# Patient Record
Sex: Female | Born: 1985 | Race: Black or African American | Hispanic: No | Marital: Married | Smoking: Never smoker
Health system: Southern US, Community
[De-identification: ages and names within clinical notes are randomized; demographics above are authoritative.]

## PROBLEM LIST (undated history)

## (undated) DIAGNOSIS — N809 Endometriosis, unspecified: Secondary | ICD-10-CM

## (undated) HISTORY — DX: Endometriosis, unspecified: N80.9

## (undated) HISTORY — PX: MYOMECTOMY: SHX85

---
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
## 2008-10-13 ENCOUNTER — Emergency Department (HOSPITAL_COMMUNITY): Admission: EM | Admit: 2008-10-13 | Discharge: 2008-10-13 | Payer: Self-pay | Admitting: Emergency Medicine

## 2008-11-26 ENCOUNTER — Emergency Department: Payer: Self-pay | Admitting: Emergency Medicine

## 2009-08-19 ENCOUNTER — Emergency Department: Payer: Self-pay | Admitting: Emergency Medicine

## 2010-04-09 ENCOUNTER — Ambulatory Visit: Payer: Self-pay | Admitting: Internal Medicine

## 2010-04-09 IMAGING — US TRANSABDOMINAL ULTRASOUND OF PELVIS
1 series · 17 of 25 positions shown · non-contrast
Comparison: none

REASON FOR EXAM: Pelvic Pain
COMMENTS:

[Series 1: transabdominal ultrasound of pelvis · 17 of 78 slices shown]
[im 1/78]
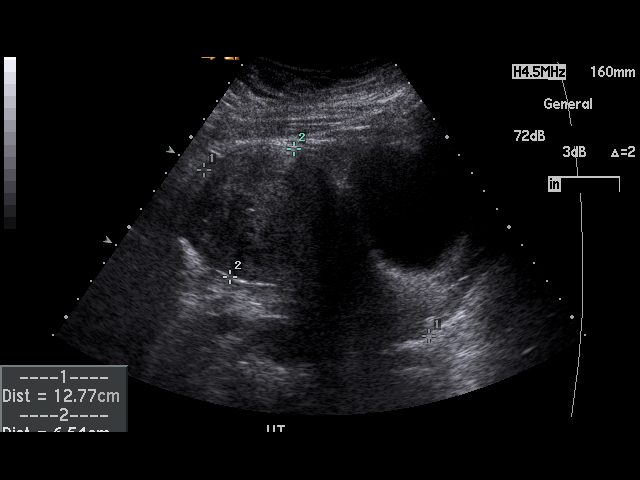
[im 7/78]
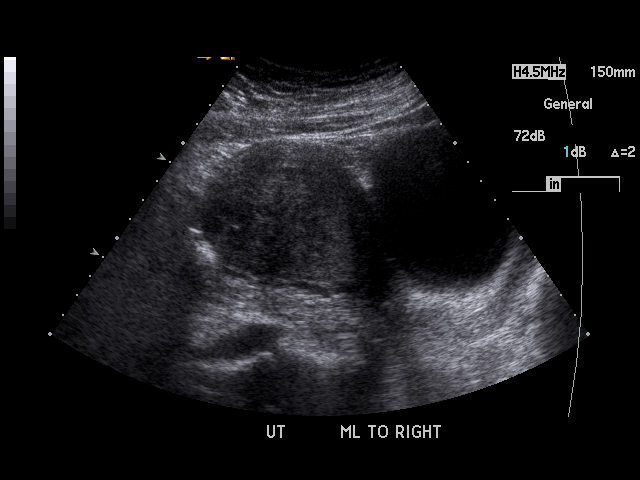
[im 10/78]
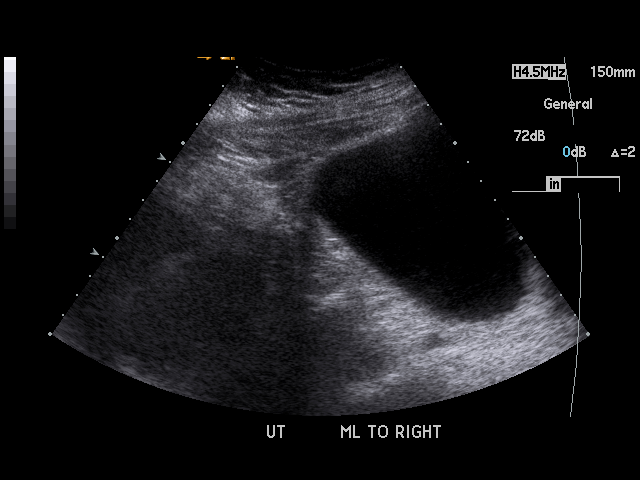
[im 17/78]
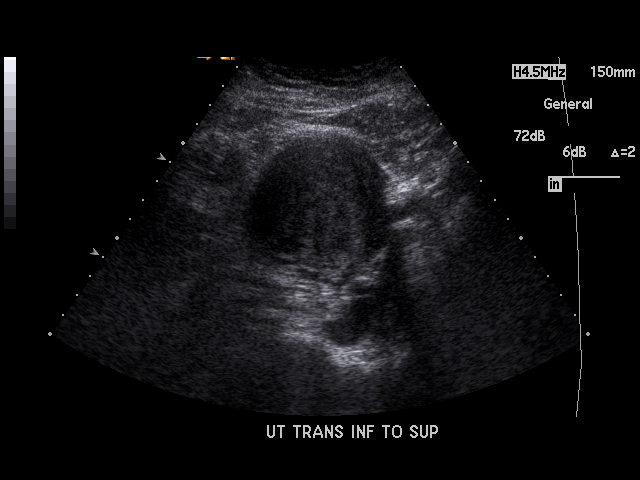
[im 20/78]
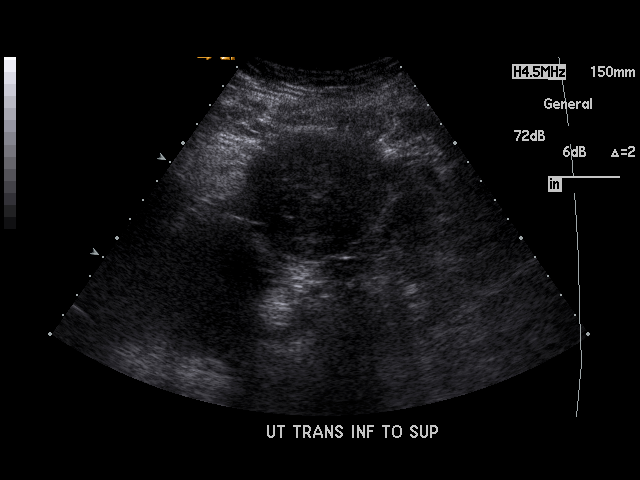
[im 26/78]
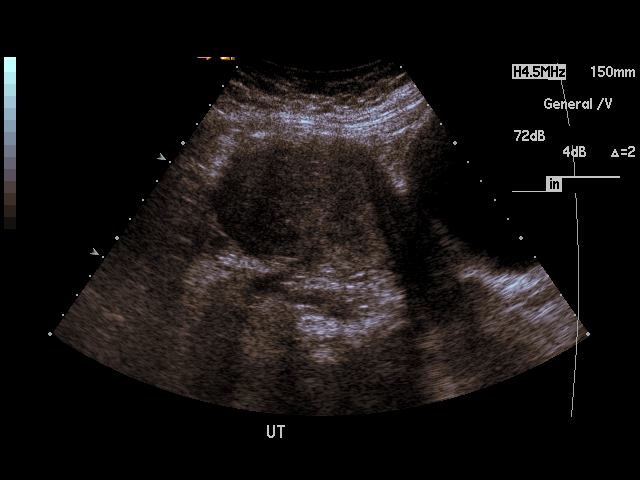
[im 29/78]
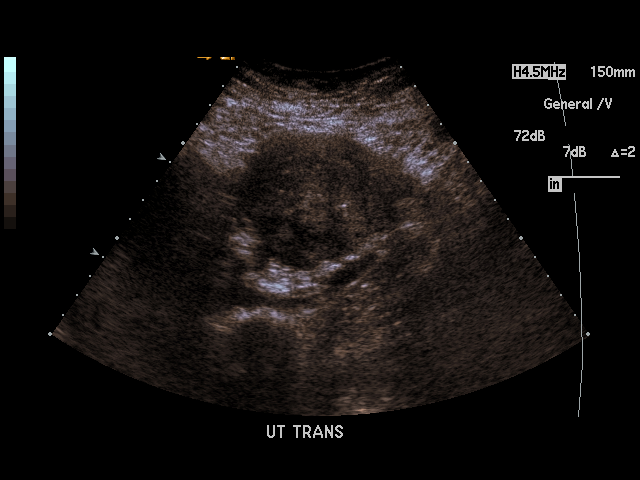
[im 36/78]
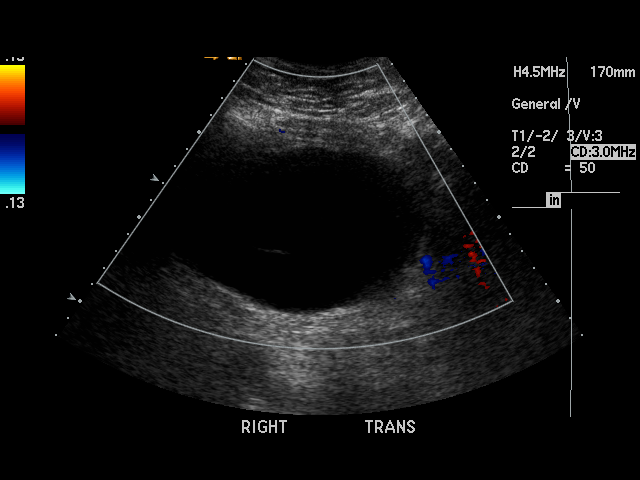
[im 39/78]
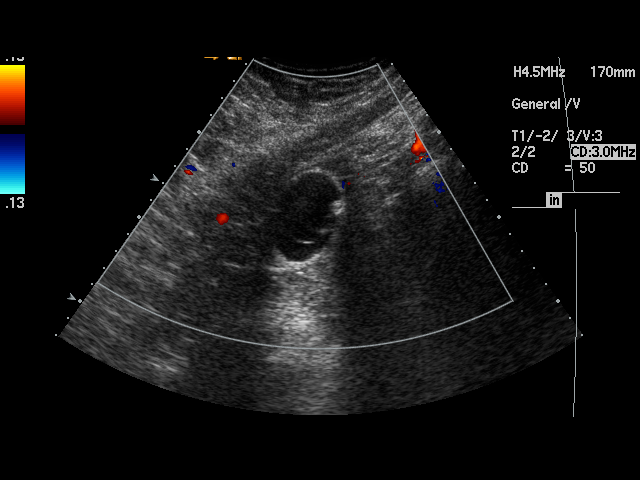
[im 42/78]
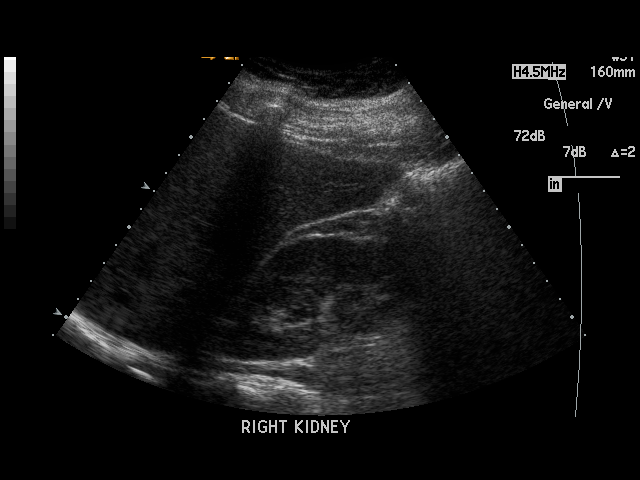
[im 49/78]
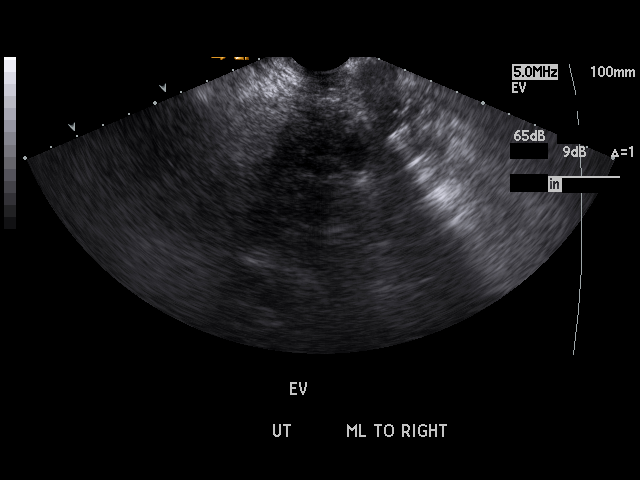
[im 52/78]
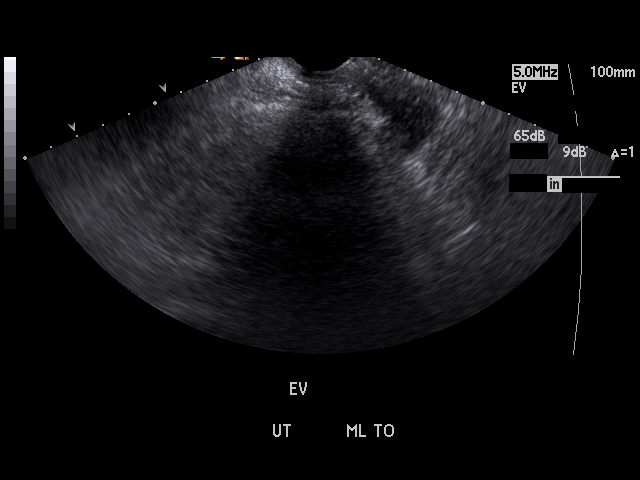
[im 58/78]
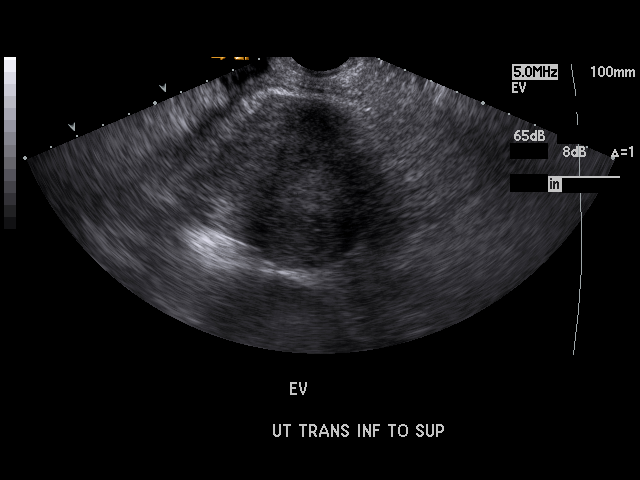
[im 61/78]
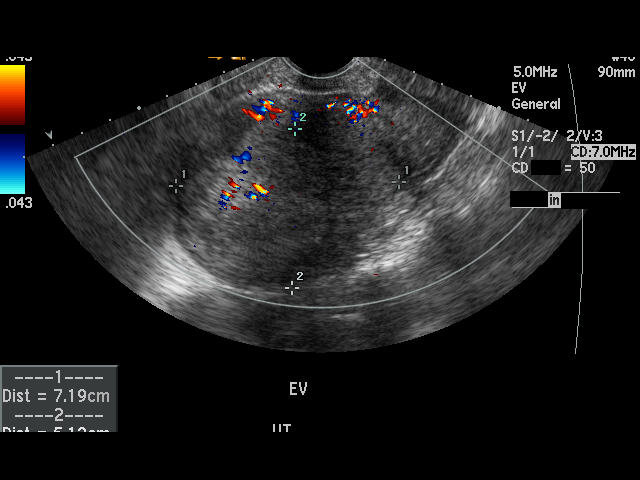
[im 68/78]
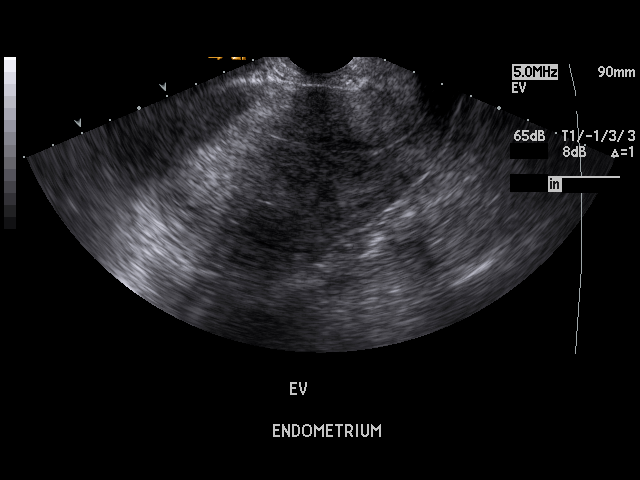
[im 71/78]
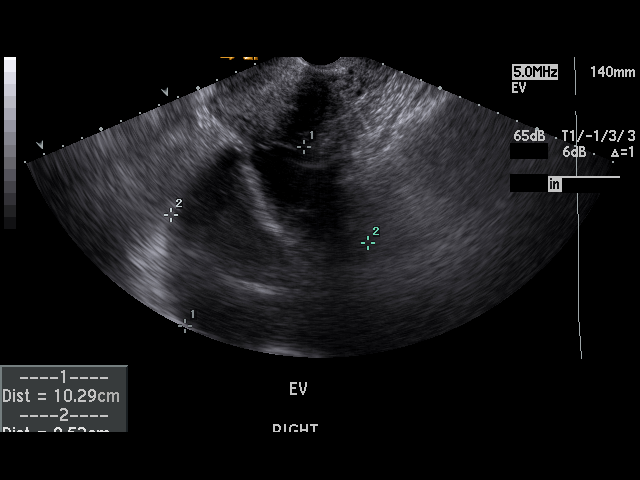
[im 78/78]
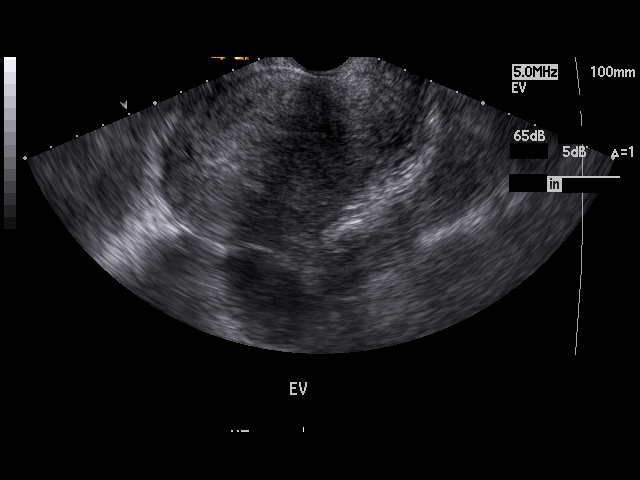

[17 of 25 positions shown; findings below may reference images not displayed]

PROCEDURE:     EDUM - EDUM PELVIS NON-OB W/TRANSVAGINAL  - [DATE]  [DATE]

RESULT:     Pelvic sonogram is performed with transabdominal and endovaginal
imaging. The uterus measures 12.8 x 6.5 x 7.4 cm. There is a large
solid-appearing mass along the posterior aspect of the uterus toward the
fundus. This displaces the endometrial stripe anteriorly. The endometrium
measures approximately 3.0 mm. There is blood flow in the uterine mass.
There is a large complex area of decreased echogenicity predominantly with
some irregular margins which, on some images, has a curvilinear or tubular
structure extending toward it. This area measures 12.9 x 12.7 x 8.0 cm.
Whether this represents a large cystic mass in the adnexa, pyosalpinx or
pelvic inflammatory disease with abscess and pyosalpinx is uncertain.
Correlate with clinical and laboratory data and gynecologic followup. Within
the left adnexa there is a cystic area measuring 2.71 x 4.91 x 3.62 cm.
There is through transmission. There appears to be internal debris and
possibly a septation associated with this cystic structure. The kidneys
appear to be grossly normal. No identified normal ovarian tissue is seen.
IMPRESSION: 1. Uterine mass posteriorly.
2. Predominately cystic abnormality in the right adnexa which certainly
could represent a cystic mass or infection and abscess.
3. Septated cyst with debris in the left adnexa.

## 2010-06-08 HISTORY — PX: OOPHORECTOMY: SHX86

## 2010-06-17 ENCOUNTER — Ambulatory Visit: Payer: Self-pay | Admitting: Obstetrics and Gynecology

## 2010-06-24 ENCOUNTER — Inpatient Hospital Stay: Payer: Self-pay | Admitting: Obstetrics and Gynecology

## 2010-06-27 LAB — PATHOLOGY REPORT

## 2012-07-08 ENCOUNTER — Emergency Department: Payer: Self-pay | Admitting: Emergency Medicine

## 2012-07-08 LAB — COMPREHENSIVE METABOLIC PANEL
Albumin: 3.6 g/dL (ref 3.4–5.0)
Alkaline Phosphatase: 58 U/L (ref 50–136)
Anion Gap: 5 — ABNORMAL LOW (ref 7–16)
BUN: 9 mg/dL (ref 7–18)
Bilirubin,Total: 0.2 mg/dL (ref 0.2–1.0)
Calcium, Total: 9 mg/dL (ref 8.5–10.1)
Chloride: 107 mmol/L (ref 98–107)
Co2: 27 mmol/L (ref 21–32)
Creatinine: 0.67 mg/dL (ref 0.60–1.30)
EGFR (African American): >60
EGFR (Non-African Amer.): >60
Glucose: 85 mg/dL (ref 65–99)
Osmolality: 275 (ref 275–301)
Potassium: 3.8 mmol/L (ref 3.5–5.1)
SGOT(AST): 13 U/L — ABNORMAL LOW (ref 15–37)
SGPT (ALT): 16 U/L (ref 12–78)
Sodium: 139 mmol/L (ref 136–145)
Total Protein: 8 g/dL (ref 6.4–8.2)

## 2012-07-08 LAB — URINALYSIS, COMPLETE
Bacteria: NONE SEEN
Bilirubin,UR: NEGATIVE
Glucose,UR: NEGATIVE mg/dL (ref 0–75)
Ketone: NEGATIVE
Nitrite: NEGATIVE
Ph: 6 (ref 4.5–8.0)
Protein: 30
RBC,UR: 5548 /HPF (ref 0–5)
Specific Gravity: 1.012 (ref 1.003–1.030)
Squamous Epithelial: NONE SEEN
WBC UR: 125 /HPF (ref 0–5)

## 2012-07-08 LAB — CBC
HCT: 32.2 % — ABNORMAL LOW (ref 35.0–47.0)
HGB: 9.8 g/dL — ABNORMAL LOW (ref 12.0–16.0)
MCH: 20.5 pg — ABNORMAL LOW (ref 26.0–34.0)
MCHC: 30.5 g/dL — ABNORMAL LOW (ref 32.0–36.0)
MCV: 67 fL — ABNORMAL LOW (ref 80–100)
Platelet: 288 10*3/uL (ref 150–440)
RBC: 4.78 10*6/uL (ref 3.80–5.20)
RDW: 19.1 % — ABNORMAL HIGH (ref 11.5–14.5)
WBC: 8.5 10*3/uL (ref 3.6–11.0)

## 2012-07-08 LAB — LIPASE, BLOOD: Lipase: 113 U/L (ref 73–393)

## 2012-07-08 LAB — WET PREP, GENITAL

## 2013-06-03 ENCOUNTER — Emergency Department: Payer: Self-pay | Admitting: Emergency Medicine

## 2013-06-03 LAB — COMPREHENSIVE METABOLIC PANEL
Albumin: 3.3 g/dL — ABNORMAL LOW (ref 3.4–5.0)
Alkaline Phosphatase: 53 U/L
Anion Gap: 5 — ABNORMAL LOW (ref 7–16)
BUN: 8 mg/dL (ref 7–18)
Bilirubin,Total: 0.3 mg/dL (ref 0.2–1.0)
Calcium, Total: 8.6 mg/dL (ref 8.5–10.1)
Chloride: 107 mmol/L (ref 98–107)
Co2: 26 mmol/L (ref 21–32)
Creatinine: 0.7 mg/dL (ref 0.60–1.30)
EGFR (African American): >60
EGFR (Non-African Amer.): >60
Glucose: 106 mg/dL — ABNORMAL HIGH (ref 65–99)
Osmolality: 274 (ref 275–301)
Potassium: 3.9 mmol/L (ref 3.5–5.1)
SGOT(AST): 9 U/L — ABNORMAL LOW (ref 15–37)
SGPT (ALT): 15 U/L (ref 12–78)
Sodium: 138 mmol/L (ref 136–145)
Total Protein: 7.6 g/dL (ref 6.4–8.2)

## 2013-06-03 LAB — CBC WITH DIFFERENTIAL/PLATELET
Basophil #: 0.2 10*3/uL — ABNORMAL HIGH (ref 0.0–0.1)
Basophil %: 1.9 %
Eosinophil #: 0.6 10*3/uL (ref 0.0–0.7)
Eosinophil %: 8.1 %
HCT: 30.1 % — ABNORMAL LOW (ref 35.0–47.0)
HGB: 8.9 g/dL — ABNORMAL LOW (ref 12.0–16.0)
Lymphocyte #: 2.2 10*3/uL (ref 1.0–3.6)
Lymphocyte %: 27.9 %
MCH: 18.9 pg — ABNORMAL LOW (ref 26.0–34.0)
MCHC: 29.6 g/dL — ABNORMAL LOW (ref 32.0–36.0)
MCV: 64 fL — ABNORMAL LOW (ref 80–100)
Monocyte #: 0.8 x10 3/mm (ref 0.2–0.9)
Monocyte %: 10.5 %
Neutrophil #: 4.1 10*3/uL (ref 1.4–6.5)
Neutrophil %: 51.6 %
Platelet: 323 10*3/uL (ref 150–440)
RBC: 4.72 10*6/uL (ref 3.80–5.20)
RDW: 19.6 % — ABNORMAL HIGH (ref 11.5–14.5)
WBC: 7.9 10*3/uL (ref 3.6–11.0)

## 2013-06-03 LAB — URINALYSIS, COMPLETE
Bacteria: NONE SEEN
Bilirubin,UR: NEGATIVE
Glucose,UR: NEGATIVE mg/dL (ref 0–75)
Ketone: NEGATIVE
Nitrite: NEGATIVE
Ph: 6 (ref 4.5–8.0)
Protein: 100
RBC,UR: 5016 /HPF (ref 0–5)
Specific Gravity: 1.015 (ref 1.003–1.030)
Squamous Epithelial: 4
WBC UR: 10 /HPF (ref 0–5)

## 2013-07-18 ENCOUNTER — Emergency Department: Payer: Self-pay | Admitting: Emergency Medicine

## 2013-07-18 LAB — CBC
HCT: 29.1 % — ABNORMAL LOW (ref 35.0–47.0)
HGB: 8.6 g/dL — ABNORMAL LOW (ref 12.0–16.0)
MCH: 19 pg — ABNORMAL LOW (ref 26.0–34.0)
MCHC: 29.4 g/dL — ABNORMAL LOW (ref 32.0–36.0)
MCV: 65 fL — ABNORMAL LOW (ref 80–100)
Platelet: 300 10*3/uL (ref 150–440)
RBC: 4.5 10*6/uL (ref 3.80–5.20)
RDW: 20.1 % — ABNORMAL HIGH (ref 11.5–14.5)
WBC: 11.7 10*3/uL — ABNORMAL HIGH (ref 3.6–11.0)

## 2013-07-18 LAB — URINALYSIS, COMPLETE
Bacteria: NONE SEEN
Bilirubin,UR: NEGATIVE
Blood: NEGATIVE
Glucose,UR: NEGATIVE mg/dL (ref 0–75)
Ketone: NEGATIVE
Nitrite: NEGATIVE
Ph: 7 (ref 4.5–8.0)
Protein: NEGATIVE
RBC,UR: <1 /HPF (ref 0–5)
Specific Gravity: 1.017 (ref 1.003–1.030)
Squamous Epithelial: 9
WBC UR: 4 /HPF (ref 0–5)

## 2013-07-18 LAB — COMPREHENSIVE METABOLIC PANEL
Albumin: 3.3 g/dL — ABNORMAL LOW (ref 3.4–5.0)
Alkaline Phosphatase: 51 U/L
Anion Gap: 3 — ABNORMAL LOW (ref 7–16)
BUN: 14 mg/dL (ref 7–18)
Bilirubin,Total: 0.2 mg/dL (ref 0.2–1.0)
Calcium, Total: 9.8 mg/dL (ref 8.5–10.1)
Chloride: 103 mmol/L (ref 98–107)
Co2: 29 mmol/L (ref 21–32)
Creatinine: 0.77 mg/dL (ref 0.60–1.30)
EGFR (African American): >60
EGFR (Non-African Amer.): >60
Glucose: 96 mg/dL (ref 65–99)
Osmolality: 270 (ref 275–301)
Potassium: 4.1 mmol/L (ref 3.5–5.1)
SGOT(AST): 16 U/L (ref 15–37)
SGPT (ALT): 14 U/L (ref 12–78)
Sodium: 135 mmol/L — ABNORMAL LOW (ref 136–145)
Total Protein: 7.8 g/dL (ref 6.4–8.2)

## 2013-07-18 LAB — PREGNANCY, URINE: Pregnancy Test, Urine: NEGATIVE m[IU]/mL

## 2013-07-18 LAB — GC/CHLAMYDIA PROBE AMP

## 2013-07-18 LAB — HCG, QUANTITATIVE, PREGNANCY: Beta Hcg, Quant.: <1 m[IU]/mL — ABNORMAL LOW

## 2013-07-18 LAB — WET PREP, GENITAL

## 2013-11-22 DIAGNOSIS — Z9889 Other specified postprocedural states: Secondary | ICD-10-CM | POA: Insufficient documentation

## 2013-11-22 DIAGNOSIS — O99019 Anemia complicating pregnancy, unspecified trimester: Secondary | ICD-10-CM | POA: Insufficient documentation

## 2013-11-22 DIAGNOSIS — O3429 Maternal care due to uterine scar from other previous surgery: Secondary | ICD-10-CM | POA: Insufficient documentation

## 2014-06-05 DIAGNOSIS — Z975 Presence of (intrauterine) contraceptive device: Secondary | ICD-10-CM | POA: Insufficient documentation

## 2014-10-02 ENCOUNTER — Emergency Department
Admit: 2014-10-02 | Disposition: A | Discharge status: Home / Self Care | Payer: Self-pay | Admitting: Emergency Medicine

## 2014-10-02 LAB — CBC WITH DIFFERENTIAL/PLATELET
Basophil #: 0.1 10*3/uL (ref 0.0–0.1)
Basophil %: 1.3 %
Eosinophil #: 0.2 10*3/uL (ref 0.0–0.7)
Eosinophil %: 3.8 %
HCT: 36.2 % (ref 35.0–47.0)
HGB: 11.2 g/dL — ABNORMAL LOW (ref 12.0–16.0)
Lymphocyte #: 1.9 10*3/uL (ref 1.0–3.6)
Lymphocyte %: 33.5 %
MCH: 22.5 pg — ABNORMAL LOW (ref 26.0–34.0)
MCHC: 30.9 g/dL — ABNORMAL LOW (ref 32.0–36.0)
MCV: 73 fL — ABNORMAL LOW (ref 80–100)
Monocyte #: 0.4 x10 3/mm (ref 0.2–0.9)
Monocyte %: 6.4 %
Neutrophil #: 3.1 10*3/uL (ref 1.4–6.5)
Neutrophil %: 55 %
Platelet: 286 10*3/uL (ref 150–440)
RBC: 4.98 10*6/uL (ref 3.80–5.20)
RDW: 18.8 % — ABNORMAL HIGH (ref 11.5–14.5)
WBC: 5.7 10*3/uL (ref 3.6–11.0)

## 2014-10-02 LAB — BASIC METABOLIC PANEL
Anion Gap: 5 — ABNORMAL LOW (ref 7–16)
BUN: 11 mg/dL
Calcium, Total: 8.9 mg/dL
Chloride: 106 mmol/L
Co2: 27 mmol/L
Creatinine: 0.72 mg/dL
EGFR (African American): >60
EGFR (Non-African Amer.): >60
Glucose: 99 mg/dL
Potassium: 4.3 mmol/L
Sodium: 138 mmol/L

## 2014-10-02 LAB — URINALYSIS, COMPLETE
Specific Gravity: 1.02 (ref 1.003–1.030)
Squamous Epithelial: NONE SEEN

## 2014-10-02 LAB — HCG, QUANTITATIVE, PREGNANCY: Beta Hcg, Quant.: <1 m[IU]/mL

## 2015-06-21 ENCOUNTER — Ambulatory Visit: Payer: BLUE CROSS/BLUE SHIELD | Attending: Obstetrics and Gynecology | Admitting: Physical Therapy

## 2015-06-21 ENCOUNTER — Encounter: Payer: Self-pay | Admitting: Physical Therapy

## 2015-06-21 DIAGNOSIS — R279 Unspecified lack of coordination: Secondary | ICD-10-CM

## 2015-06-21 DIAGNOSIS — M629 Disorder of muscle, unspecified: Secondary | ICD-10-CM | POA: Insufficient documentation

## 2015-06-21 DIAGNOSIS — Z7409 Other reduced mobility: Secondary | ICD-10-CM | POA: Insufficient documentation

## 2015-06-21 NOTE — Therapy (Signed)
[place] [facility] MAIN [facility] [address] [place], [place], [address] Phone: [phone]   Fax:  [phone]  Physical Therapy Evaluation  Patient Details  Name: Chelsea Willis MRN: [id number] Date of Birth: 04/07/1986 Referring Provider: Carles ColletSchiff  Encounter Date: 06/21/2015      PT End of Session - 06/21/15 1909    Visit Number 1   Number of Visits 12   Date for PT Re-Evaluation 09/13/15   PT Start Time 0815   PT Stop Time 0910   PT Time Calculation (min) 55 min   Activity Tolerance Patient tolerated treatment well;No increased pain   Behavior During Therapy [facility] for tasks assessed/performed      Past Medical History  Diagnosis Date  . Endometriosis     Past Surgical History  Procedure Laterality Date  . Abdominal hysterectomy    . Myomectomy Right     2012  . Oophorectomy Right 2012  . Cesarean section  2015    There were no vitals filed for this visit.  Visit Diagnosis:  Fascial defect - Plan: PT plan of care cert/re-cert  Lack of coordination - Plan: PT plan of care cert/re-cert  Mobility impaired - Plan: PT plan of care cert/re-cert      Subjective Assessment - 06/21/15 0823    Subjective Pt experiences a constant "throbbing, sharp, crampy pain " (7/10)   in her " lower stomach, sometimes upper stomach". Pt has been dealing with these Sx for the past 30 yo except when she was pregnant with 2nd child Feb 2015 to Nov 2016.  Pt reports this pain can hurt to the point she has to stay in bedd, take of work, and decreased household activities. Pt has had to miss work 3x across 2 months. Pt tries to take ibuprofen (3 tablets at 500 mg) when the pain is unbearable 10/10 whiich has occurred  4x/ month. Typically if pain is at 7/10, she takes one tablet at 500 mg. Pt has found opiates and heat to be ineeffective to relieve pain.  Currently, pain is at a 8.5-9/10.  Denied constipation, urinary incontinence, dyspareunia. Pt has  changed her diet, eliminating sodas, fries. Pt will be going to the gym tomorrow. Pt plans to do squats,      Pertinent History multiple abadominal surgeries, one vaginal delivery and one c-section, Hx of endometriosis   Patient Stated Goals lose weight, figure out how to get out of pain to play with her kids             [facility] PT Assessment - 06/21/15 0853    Assessment   Medical Diagnosis pelvic pain    Referring Provider Schiff   Precautions   Precautions None   Restrictions   Weight Bearing Restrictions No   Balance Screen   Has the patient fallen in the past 6 months No   Prior Function   Level of Independence Independent   Observation/Other Assessments   Observations 350 lbs  ankles crossed under chair   Other Surveys  --  PDI 73%   Coordination   Gross Motor Movements are Fluid and Coordinated --  limited diaphragmatic excursion, elevated ribcage    Fine Motor Movements are Fluid and Coordinated --  abdominal straining w/ cue for bowel movement   Posture/Postural Control   Posture Comments increased lumbar lordosis, anterior tilt of pelvis   ROM / Strength   AROM / PROM / Strength --  rotating L and ext w/ p! in  standing    Palpation   Palpation comment scar restrictions along B LQ of abdomen, tenderness and increased tensions along bilateral and medial sections of entire abdomen to sternum    Bed Mobility   Bed Mobility --  half crunch method, guided to log roll                            PT Education - 06/21/15 0846    Education provided No             PT Long Term Goals - 06/21/15 0843    PT LONG TERM GOAL #1   Title Pt will decrease her PDI score from 71% to < 50% in order to increase QOL.    Time 12   Period Weeks   Status New   PT LONG TERM GOAL #2   Title Pt will reprot no abdominal pain when carrying son across half the length of the mall in order to participate with her family.    Time 12   Period Weeks   Status New    PT LONG TERM GOAL #3   Title Pt will be able to stand when singing at church with less sitting breaks from 4-5x to 2x during thr 1.5 hr practice in order to particiapte in community events.    Time 12   Period Weeks   Status New   PT LONG TERM GOAL #4   Title Pt will able to perform full spinal L rotation and extension in standing without report of pain in order to perform household activities.    Time 12   Period Weeks   Status New   PT LONG TERM GOAL #5   Title Pt's weight will decrease from 350 lbs to 340lbs in order to promote health and being with decreased load on deep core system.    Time 12   Period Weeks   Status New               Plan - 06/21/15 1911    Clinical Impression Statement Pt is a 30 yo who complains of chronic abdominal pain that have impacted her ability to sit to stand,  work, play with her children, and perform household work. Pt's personal factors that contribute to her pain include Hx of C-section, vaginal delivery, hysterectomy, and endometriosis. Pt's clinical presentations include tenderness w/ palpation along upper /lower quadrants of her abdomen, abdominal scar restrictions, postural deficits, abdominal pain with spinal rotation L and extension,  poor deep core coordination/strength,  faulty patterns of movement that placed strain on her abdomen and pelvic floor, and poor understanding on how to exercise at the gym safely.  Given these factors, pt's condition is  evolving and moderate in complexity.     Pt will benefit from skilled therapeutic intervention in order to improve on the following deficits Obesity;Pain;Increased fascial restricitons;Decreased scar mobility;Decreased endurance;Decreased coordination;Decreased safety awareness;Decreased mobility;Impaired flexibility;Decreased activity tolerance;Difficulty walking;Postural dysfunction;Abnormal gait;Decreased strength;Increased muscle spasms;Improper body mechanics;Decreased range of motion   Rehab  Potential Good   Clinical Impairments Affecting Rehab Potential obesity, sedentary job,    PT Frequency 1x / week   PT Duration 12 weeks   PT Treatment/Interventions ADLs/Self Care Home Management;Aquatic Therapy;Cryotherapy;Electrical Stimulation;Moist Heat;Traction;Patient/family education;Neuromuscular re-education;Balance training;Therapeutic exercise;Therapeutic activities;Functional mobility training;Stair training;Gait training;Manual techniques;Scar mobilization;Dry needling;Energy conservation;Passive range of motion  education on principle of exercise to create a routine to help address her weight loss goal with exercise   Consulted  and Agree with Plan of Care Patient         Problem List There are no active problems to display for this patient.   Mariane Masters  ,PT, DPT, E-RYT  06/21/2015, 7:34 PM   [facility] MAIN [facility] SERVICES [address] [place], [place], [address] Phone: [phone]   Fax:  [phone]  Name: Chelsea Willis MRN: [id number] Date of Birth: 04/07/1986

## 2015-06-21 NOTE — Patient Instructions (Addendum)
Sit to stand with exhale    Exhale w. Stand     Log rolling (handout)   Scar massage. (handout)    Circular belly massage

## 2015-06-24 ENCOUNTER — Ambulatory Visit: Payer: BLUE CROSS/BLUE SHIELD | Admitting: Physical Therapy

## 2015-06-24 DIAGNOSIS — R279 Unspecified lack of coordination: Secondary | ICD-10-CM

## 2015-06-24 DIAGNOSIS — M629 Disorder of muscle, unspecified: Secondary | ICD-10-CM | POA: Diagnosis not present

## 2015-06-24 DIAGNOSIS — Z7409 Other reduced mobility: Secondary | ICD-10-CM

## 2015-06-24 NOTE — Patient Instructions (Addendum)
You are now ready to begin training the deep core muscles system: diaphragm, transverse abdominis, pelvic floor . These muscles must work together as a team.           The key to these exercises to train the brain to coordinate the timing of these muscles and to have them turn on for long periods of time to hold you upright against gravity (especially important if you are on your feet all day).These muscles are postural muscles and play a role stabilizing your spine and bodyweight. By doing these repetitions slowly and correctly instead of doing crunches, you will achieve a flatter belly without a lower pooch. You are also placing your spine in a more neutral position and breathing properly which in turn, decreases your risk for problems related to your pelvic floor, abdominal, and low back such as pelvic organ prolapse, hernias, diastasis recti (separation of superficial muscles), disk herniations, spinal fractures. These exercises set a solid foundation for you to later progress to resistance/ strength training with therabands and weights and return to other typical fitness exercises with a stronger deeper core.   This week: Practice only Level 1-2. Hold off on 3-4 until our next visit.      Standing and sitting posture  -find pelvic neutral   Standing with one foot forward on ski tracks (wide as your hips)  Standing with feet hip width apart with KNEES UNLOCKED  Pelvic neutral

## 2015-06-26 NOTE — Therapy (Addendum)
[place] [facility] MAIN [facility] SERVICES [address] [place], [place], [address] Phone: [phone]   Fax:  [phone]  Physical Therapy Treatment  Patient Details  Name: Chelsea Willis MRN: [id number] Date of Birth: Nov 11, 1985 Referring Provider: Carles Collet  Encounter Date: 06/24/2015      PT End of Session - 06/30/15 2251    Visit Number 2   Number of Visits 12   Date for PT Re-Evaluation 09/13/15   PT Start Time 1620   PT Stop Time 1715   PT Time Calculation (min) 55 min   Activity Tolerance Patient tolerated treatment well;No increased pain   Behavior During Therapy [facility] for tasks assessed/performed      Past Medical History  Diagnosis Date  . Endometriosis     Past Surgical History  Procedure Laterality Date  . Abdominal hysterectomy    . Myomectomy Right     2012  . Oophorectomy Right 2012  . Cesarean section  2015    There were no vitals filed for this visit.  Visit Diagnosis:  Fascial defect  Lack of coordination  Mobility impaired      Subjective Assessment - 06/30/15 2241    Subjective Pt reported her abdominal pain was decreased to a barable level 5/10 from 8/10 after last session which lasted for the past 3 days. Pt's husband was able to massage her scar. Pt's pain recoccurred at 8/10 today but it decrease to 5/10 after she performed self-abdominal massage.   Pertinent History multiple abadominal surgeries, one vaginal delivery and one c-section, Hx of endometriosis   Patient Stated Goals lose weight, figure out how to get out of pain to play with her kids             [facility] PT Assessment - 06/30/15 2241    Observation/Other Assessments   Observations hyperextended knees in stance    Palpation   Palpation comment decreased scar restriction greater on R end  > L    Bed Mobility   Bed Mobility --  mild cuing for log rolling                  Pelvic Floor Special Questions - 06/30/15 2247     Pelvic Floor Internal Exam pt consented verbally without contraindications noted   Exam Type Vaginal   Palpation mm tensions and tenderness on R bulbospongiosis, L ischiocav           [facility] Adult PT Treatment/Exercise - 06/30/15 2241    Neuro Re-ed    Neuro Re-ed Details  standing posture, feet placement to decrease load on back, deep core 1-2    Manual Therapy   Manual therapy comments scar massage and myofascial release over suprapubic area   Internal Pelvic Floor thiele massage, sustained light pressure over R bulbospongiosus, R ischiocavernosus                      PT Long Term Goals - 06/21/15 0843    PT LONG TERM GOAL #1   Title Pt will decrease her PDI score from 71% to < 50% in order to increase QOL.    Time 12   Period Weeks   Status New   PT LONG TERM GOAL #2   Title Pt will reprot no abdominal pain when carrying son across half the length of the mall in order to participate with her family.    Time 12   Period Weeks   Status New  PT LONG TERM GOAL #3   Title Pt will be able to stand when singing at church with less sitting breaks from 4-5x to 2x during thr 1.5 hr practice in order to particiapte in community events.    Time 12   Period Weeks   Status New   PT LONG TERM GOAL #4   Title Pt will able to perform full spinal L rotation and extension in standing without report of pain in order to perform household activities.    Time 12   Period Weeks   Status New   PT LONG TERM GOAL #5   Title Pt's weight will decrease from 350 lbs to 340lbs in order to promote health and being with decreased load on deep core system.    Time 12   Period Weeks   Status New               Plan - 06/30/15 2251    Clinical Impression Statement Pt reported she has felt decreased pain since last session with application of belly massage. After today's Tx, her abdominal pain decreased from 5/10 to 3/10. Pt showed increased mm tensions/ tenderness in pelvic floor mm  which decreased after post Tx. Pt also tolerated abdominal scar massage on R side of the scar which showed improved pliability after Tx. Progressed pt to deep core exercises 1-2 and pt demo'd standing mechanics to apply at work to redistribute loading forces through lumbopelvic area. Anticipate pt will continue to progress well towards her goals.    Pt will benefit from skilled therapeutic intervention in order to improve on the following deficits Obesity;Pain;Increased fascial restricitons;Decreased scar mobility;Decreased endurance;Decreased coordination;Decreased safety awareness;Decreased mobility;Impaired flexibility;Decreased activity tolerance;Difficulty walking;Postural dysfunction;Abnormal gait;Decreased strength;Increased muscle spasms;Improper body mechanics;Decreased range of motion   Rehab Potential Good   Clinical Impairments Affecting Rehab Potential obesity, sedentary job,    PT Frequency 1x / week   PT Duration 12 weeks   PT Treatment/Interventions ADLs/Self Care Home Management;Aquatic Therapy;Cryotherapy;Electrical Stimulation;Moist Heat;Traction;Patient/family education;Neuromuscular re-education;Balance training;Therapeutic exercise;Therapeutic activities;Functional mobility training;Stair training;Gait training;Manual techniques;Scar mobilization;Dry needling;Energy conservation;Passive range of motion  education on principle of exercise to create a routine to help address her weight loss goal with exercise   Consulted and Agree with Plan of Care Patient        Problem List There are no active problems to display for this patient.   Mariane Masters ,PT, DPT, E-RYT  06/30/2015, 10:56 PM  [place] [facility] MAIN [facility] SERVICES [address] [place], [place], [address] Phone: [phone]   Fax:  [phone]  Name: Lanell Jordan Caraveo MRN: [id number] Date of Birth: 11/06/1985

## 2015-07-02 ENCOUNTER — Ambulatory Visit: Payer: BLUE CROSS/BLUE SHIELD | Admitting: Physical Therapy

## 2015-07-03 ENCOUNTER — Ambulatory Visit: Payer: BLUE CROSS/BLUE SHIELD | Admitting: Physical Therapy

## 2015-07-03 DIAGNOSIS — M629 Disorder of muscle, unspecified: Secondary | ICD-10-CM

## 2015-07-03 DIAGNOSIS — R279 Unspecified lack of coordination: Secondary | ICD-10-CM

## 2015-07-03 DIAGNOSIS — Z7409 Other reduced mobility: Secondary | ICD-10-CM

## 2015-07-03 NOTE — Therapy (Signed)
[facility] MAIN [facility] SERVICES [address] [place], [place], [address] Phone: [phone]   Fax:  [phone]  Physical Therapy Treatment  Patient Details  Name: Chelsea Willis MRN: [id number] Date of Birth: [birth year] Referring Provider: Carles Collet  Encounter Date: 07/03/2015      PT End of Session - 07/03/15 2220    Visit Number 3   Number of Visits 12   Date for PT Re-Evaluation 09/13/15   PT Start Time 1750   PT Stop Time 1825   PT Time Calculation (min) 35 min   Activity Tolerance Patient tolerated treatment well;No increased pain   Behavior During Therapy [facility] for tasks assessed/performed      Past Medical History  Diagnosis Date  . Endometriosis     Past Surgical History  Procedure Laterality Date  . Abdominal hysterectomy    . Myomectomy Right     2012  . Oophorectomy Right 2012  . Cesarean section  2015    There were no vitals filed for this visit.  Visit Diagnosis:  Fascial defect  Lack of coordination  Mobility impaired      Subjective Assessment - 07/03/15 1751    Subjective Back pain has improved by 45% . Pelvic pain came on two days later after the session when she had a biopsy for endometriosis status and has lasted for the past 3 days. Today, pelvic pain is at a 9/10.    Pertinent History multiple abadominal surgeries, one vaginal delivery and one c-section, Hx of endometriosis   Patient Stated Goals lose weight, figure out how to get out of pain to play with her kids             [facility] PT Assessment - 07/03/15 2210    Observation/Other Assessments   Observations upper trap tensions    Palpation   Palpation comment decreased scar restriction R, focused on distal/ medial side of scar today                      [facility] Adult PT Treatment/Exercise - 07/03/15 2217    Neuro Re-ed    Neuro Re-ed Details  guided body scan and explained benefits with relaxation to manage endometriosis     emailed pt audio file    Manual Therapy   Manual therapy comments scar massage, abdominal massage                PT Education - 07/03/15 2219    Education provided Yes   Education Details HEP   Person(s) Educated Patient   Methods Explanation;Demonstration;Tactile cues;Verbal cues   Comprehension Verbalized understanding;Returned demonstration             PT Long Term Goals - 06/21/15 0843    PT LONG TERM GOAL #1   Title Pt will decrease her PDI score from 71% to < 50% in order to increase QOL.    Time 12   Period Weeks   Status New   PT LONG TERM GOAL #2   Title Pt will reprot no abdominal pain when carrying son across half the length of the mall in order to participate with her family.    Time 12   Period Weeks   Status New   PT LONG TERM GOAL #3   Title Pt will be able to stand when singing at church with less sitting breaks from 4-5x to 2x during thr 1.5 hr practice in order to particiapte in community events.  Time 12   Period Weeks   Status New   PT LONG TERM GOAL #4   Title Pt will able to perform full spinal L rotation and extension in standing without report of pain in order to perform household activities.    Time 12   Period Weeks   Status New   PT LONG TERM GOAL #5   Title Pt's weight will decrease from 350 lbs to 340lbs in order to promote health and being with decreased load on deep core system.    Time 12   Period Weeks   Status New               Plan - 07/03/15 2220    Clinical Impression Statement Pt reported 9/10 pelvic pain decreased to 7/10 pain after abdominal/scar massage and guided body scan technique. Pt's appt was abbreviated due to late arrival. Pt continues to progress well with report of 45% improvement on her LBP.  Pt  continues to require skilled PT to achieve goals.    Pt will benefit from skilled therapeutic intervention in order to improve on the following deficits Obesity;Pain;Increased fascial  restricitons;Decreased scar mobility;Decreased endurance;Decreased coordination;Decreased safety awareness;Decreased mobility;Impaired flexibility;Decreased activity tolerance;Difficulty walking;Postural dysfunction;Abnormal gait;Decreased strength;Increased muscle spasms;Improper body mechanics;Decreased range of motion   Rehab Potential Good   Clinical Impairments Affecting Rehab Potential obesity, sedentary job,    PT Frequency 1x / week   PT Duration 12 weeks   PT Treatment/Interventions ADLs/Self Care Home Management;Aquatic Therapy;Cryotherapy;Electrical Stimulation;Moist Heat;Traction;Patient/family education;Neuromuscular re-education;Balance training;Therapeutic exercise;Therapeutic activities;Functional mobility training;Stair training;Gait training;Manual techniques;Scar mobilization;Dry needling;Energy conservation;Passive range of motion  education on principle of exercise to create a routine to help address her weight loss goal with exercise   Consulted and Agree with Plan of Care Patient        Problem List There are no active problems to display for this patient.   Mariane Masters ,PT, DPT, E-RYT  07/03/2015, 10:24 PM  [place] [facility] MAIN [facility] SERVICES [address] [place], [place], [address] Phone: [phone]   Fax:  [phone]  Name: Chelsea Willis MRN: [id number] Date of Birth: 31-Oct-1985

## 2015-07-03 NOTE — Patient Instructions (Signed)
Body scan technique (audio emailed to pt)

## 2015-07-08 ENCOUNTER — Ambulatory Visit: Payer: BLUE CROSS/BLUE SHIELD | Admitting: Physical Therapy

## 2015-07-15 ENCOUNTER — Ambulatory Visit: Payer: BLUE CROSS/BLUE SHIELD | Attending: Obstetrics and Gynecology | Admitting: Physical Therapy

## 2015-07-22 ENCOUNTER — Ambulatory Visit: Payer: BLUE CROSS/BLUE SHIELD | Admitting: Physical Therapy

## 2015-07-29 ENCOUNTER — Ambulatory Visit: Payer: BLUE CROSS/BLUE SHIELD | Admitting: Physical Therapy

## 2015-08-03 ENCOUNTER — Emergency Department
Admission: EM | Admit: 2015-08-03 | Discharge: 2015-08-03 | Disposition: A | Discharge status: Home / Self Care | Payer: BLUE CROSS/BLUE SHIELD | Attending: Student | Admitting: Student

## 2015-08-03 ENCOUNTER — Encounter: Payer: Self-pay | Admitting: Emergency Medicine

## 2015-08-03 DIAGNOSIS — N939 Abnormal uterine and vaginal bleeding, unspecified: Secondary | ICD-10-CM | POA: Diagnosis present

## 2015-08-03 DIAGNOSIS — Z3202 Encounter for pregnancy test, result negative: Secondary | ICD-10-CM | POA: Insufficient documentation

## 2015-08-03 DIAGNOSIS — R102 Pelvic and perineal pain: Secondary | ICD-10-CM

## 2015-08-03 DIAGNOSIS — N946 Dysmenorrhea, unspecified: Secondary | ICD-10-CM | POA: Insufficient documentation

## 2015-08-03 DIAGNOSIS — Z79899 Other long term (current) drug therapy: Secondary | ICD-10-CM | POA: Insufficient documentation

## 2015-08-03 LAB — COMPREHENSIVE METABOLIC PANEL
ALT: 14 U/L (ref 14–54)
AST: 14 U/L — ABNORMAL LOW (ref 15–41)
Albumin: 3.5 g/dL (ref 3.5–5.0)
Alkaline Phosphatase: 49 U/L (ref 38–126)
Anion gap: 7 (ref 5–15)
BUN: 9 mg/dL (ref 6–20)
CO2: 27 mmol/L (ref 22–32)
Calcium: 8.6 mg/dL — ABNORMAL LOW (ref 8.9–10.3)
Chloride: 104 mmol/L (ref 101–111)
Creatinine, Ser: 0.63 mg/dL (ref 0.44–1.00)
GFR calc Af Amer: >60 mL/min (ref >60)
GFR calc non Af Amer: >60 mL/min (ref >60)
Glucose, Bld: 105 mg/dL — ABNORMAL HIGH (ref 65–99)
Potassium: 3.9 mmol/L (ref 3.5–5.1)
Sodium: 138 mmol/L (ref 135–145)
Total Bilirubin: 0.3 mg/dL (ref 0.3–1.2)
Total Protein: 7.3 g/dL (ref 6.5–8.1)

## 2015-08-03 LAB — CBC WITH DIFFERENTIAL/PLATELET
Basophils Absolute: 0.1 10*3/uL (ref 0–0.1)
Basophils Relative: 1 %
Eosinophils Absolute: 0.1 10*3/uL (ref 0–0.7)
Eosinophils Relative: 1 %
HCT: 34 % — ABNORMAL LOW (ref 35.0–47.0)
Hemoglobin: 10.9 g/dL — ABNORMAL LOW (ref 12.0–16.0)
Lymphocytes Relative: 21 %
Lymphs Abs: 1.5 10*3/uL (ref 1.0–3.6)
MCH: 23.3 pg — ABNORMAL LOW (ref 26.0–34.0)
MCHC: 32.2 g/dL (ref 32.0–36.0)
MCV: 72.4 fL — ABNORMAL LOW (ref 80.0–100.0)
Monocytes Absolute: 0.5 10*3/uL (ref 0.2–0.9)
Monocytes Relative: 7 %
Neutro Abs: 5 10*3/uL (ref 1.4–6.5)
Neutrophils Relative %: 70 %
Platelets: 244 10*3/uL (ref 150–440)
RBC: 4.69 MIL/uL (ref 3.80–5.20)
RDW: 22.7 % — ABNORMAL HIGH (ref 11.5–14.5)
WBC: 7.1 10*3/uL (ref 3.6–11.0)

## 2015-08-03 LAB — POCT PREGNANCY, URINE: Preg Test, Ur: NEGATIVE

## 2015-08-03 MED ORDER — MORPHINE SULFATE (PF) 4 MG/ML IV SOLN
8.0000 mg | Freq: Once | INTRAVENOUS | Status: AC
  Administered 2015-08-03: 8 mg via INTRAVENOUS
  Filled 2015-08-03: qty 2

## 2015-08-03 MED ORDER — ONDANSETRON 4 MG PO TBDP
4.0000 mg | ORAL_TABLET | Freq: Once | ORAL | Status: AC
  Administered 2015-08-03: 4 mg via ORAL
  Filled 2015-08-03: qty 1

## 2015-08-03 MED ORDER — OXYCODONE HCL 5 MG PO TABS: 5.0000 mg | ORAL_TABLET | Freq: Four times a day (QID) | ORAL | Status: DC | PRN

## 2015-08-03 MED ORDER — ONDANSETRON 4 MG PO TBDP: 4.0000 mg | ORAL_TABLET | Freq: Three times a day (TID) | ORAL | Status: DC | PRN

## 2015-08-03 NOTE — ED Notes (Signed)
Patient states that she is saturating 4 10hr pads per hour with heavy vaginal blood. Patient is in room comfortable NAD noted

## 2015-08-03 NOTE — ED Notes (Signed)
Pt here with clotting and cramping for 3 days now, states she has endometriosis.

## 2015-08-03 NOTE — ED Notes (Signed)
Takes flexeril for pain, however states it is not helping. Here today for pain control.

## 2015-08-03 NOTE — ED Provider Notes (Addendum)
[facility] Emergency Department Provider Note  ____________________________________________  Time seen: Approximately 9:21 AM  I have reviewed the triage vital signs and the nursing notes.   HISTORY  Chief Complaint Abdominal Cramping and Vaginal Bleeding    HPI Chelsea Willis is a 30 y.o. female with history of endometriosis and left pectoralis pelvic pain related to the floor spasm, fibroids, adenomyosis as well as ultrasound appearance of complex isthmocele who presents for evaluation of 3 days of lower abdominal cramping as well as vaginal bleeding in the setting of her menstrual period, gradual onset, constant since onset, currently moderate to severe, no modifying factors. The patient reports that she missed her period last month. She started having her period 3 days ago however it has been very painful. She typically takes Flexeril for pain but reports it is no longer helping. She reports this is the same pain she typically experiences however today it feels more severe. Of note, she is scheduled to have a hysterectomy at the end of March at [facility] for definitive treatment of this ongoing pelvic pain. No nausea, vomiting, diarrhea, fevers or chills. No chest pain or difficulty breathing. She is status post right oopherectomy and previous myomectomy.   Past Medical History  Diagnosis Date  . Endometriosis     There are no active problems to display for this patient.   Past Surgical History  Procedure Laterality Date  . Abdominal hysterectomy    . Myomectomy Right     2012  . Oophorectomy Right 2012  . Cesarean section  2015    Current Outpatient Rx  Name  Route  Sig  Dispense  Refill  . cyclobenzaprine (FLEXERIL) 10 MG tablet   Oral   Take 10 mg by mouth 3 (three) times daily as needed. For muscle spasms.         . ferrous sulfate 325 (65 FE) MG EC tablet   Oral   Take 325 mg by mouth 2 (two) times daily.         . Multiple  Vitamins-Minerals (MULTIVITAMIN WITH MINERALS) tablet   Oral   Take 1 tablet by mouth daily.         . ondansetron (ZOFRAN ODT) 4 MG disintegrating tablet   Oral   Take 1 tablet (4 mg total) by mouth every 8 (eight) hours as needed for nausea or vomiting.   12 tablet   0   . oxyCODONE (ROXICODONE) 5 MG immediate release tablet   Oral   Take 1 tablet (5 mg total) by mouth every 6 (six) hours as needed for moderate pain. Do not drive while taking this medication.   10 tablet   0     Allergies Hydrocodone-acetaminophen  Family History  Problem Relation Age of Onset  . Hypertension Father   . Hypertension Maternal Grandmother   . Drug abuse Maternal Grandmother   . Hypertension Paternal Grandmother   . Drug abuse Paternal Grandmother   . Hypertension Paternal Grandfather     Social History Social History  Substance Use Topics  . Smoking status: Never Smoker   . Smokeless tobacco: None  . Alcohol Use: No    Review of Systems Constitutional: No fever/chills Eyes: No visual changes. ENT: No sore throat. Cardiovascular: Denies chest pain. Respiratory: Denies shortness of breath. Gastrointestinal: + abdominal pain.  No nausea, no vomiting.  No diarrhea.  No constipation. Genitourinary: Negative for dysuria. Musculoskeletal: Negative for back pain. Skin: Negative for rash. Neurological: Negative for headaches, focal  weakness or numbness.  10-point ROS otherwise negative.  ____________________________________________   PHYSICAL EXAM:  VITAL SIGNS: ED Triage Vitals  Enc Vitals Group     BP 08/03/15 0911 113/75 mmHg     Pulse Rate 08/03/15 0911 79     Resp 08/03/15 0911 18     Temp 08/03/15 0911 98.7 F (37.1 C)     Temp Source 08/03/15 0911 Oral     SpO2 08/03/15 0911 90 %     Weight 08/03/15 0911 333 lb (151.048 kg)     Height 08/03/15 0911  (1.651 m)     Head Cir --      Peak Flow --      Pain Score 08/03/15 0914 10     Pain Loc --      Pain Edu?  --      Excl. in GC? --     Constitutional: Alert and oriented. Well appearing and in no acute distress. Eyes: Conjunctivae are normal. PERRL. EOMI. Head: Atraumatic. Nose: No congestion/rhinnorhea. Mouth/Throat: Mucous membranes are moist.  Oropharynx non-erythematous. Neck: No stridor.  Cardiovascular: Normal rate, regular rhythm. Grossly normal heart sounds.  Good peripheral circulation. Respiratory: Normal respiratory effort.  No retractions. Lungs CTAB. Gastrointestinal: Soft with moderate tenderness throughout the lower abdomen, no rebound or guarding. No CVA tenderness. Pelvic: Moderate amount of dark blood in the vaginal vault with some clots. Musculoskeletal: No lower extremity tenderness nor edema.  No joint effusions. Neurologic:  Normal speech and language. No gross focal neurologic deficits are appreciated. No gait instability. Skin:  Skin is warm, dry and intact. No rash noted. Psychiatric: Mood and affect are normal. Speech and behavior are normal.  ____________________________________________   LABS (all labs ordered are listed, but only abnormal results are displayed)  Labs Reviewed  CBC WITH DIFFERENTIAL/PLATELET - Abnormal; Notable for the following:    Hemoglobin 10.9 (*)    HCT 34.0 (*)    MCV 72.4 (*)    MCH 23.3 (*)    RDW 22.7 (*)    All other components within normal limits  COMPREHENSIVE METABOLIC PANEL - Abnormal; Notable for the following:    Glucose, Bld 105 (*)    Calcium 8.6 (*)    AST 14 (*)    All other components within normal limits  POC URINE PREG, ED  POCT PREGNANCY, URINE   ____________________________________________  EKG  none ____________________________________________  RADIOLOGY  none ____________________________________________   PROCEDURES  Procedure(s) performed: None  Critical Care performed: No  ____________________________________________   INITIAL IMPRESSION / ASSESSMENT AND PLAN / ED COURSE  Pertinent  labs & imaging results that were available during my care of the patient were reviewed by me and considered in my medical decision making (see chart for details).  Chelsea Willis is a 30 y.o. female with history of endometriosis and left pectoralis pelvic pain related to the floor spasm, fibroids, adenomyosis as well as ultrasound appearance of complex isthmocele who presents for evaluation of 3 days of lower abdominal cramping as well as vaginal bleeding in the setting of her menstrual period. On exam, she is generally well-appearing and in no acute distress. Vital signs stable, she is afebrile. She does have tenderness to palpation throughout the lower abdomen. Her pelvic exam shows a moderate amount of vaginal bleeding in the vault, consistent with heavy period. Labs reviewed. Her hemoglobin today is 10.9 which is essentially unchanged from what it was in December 2016. CMP is generally unremarkable. She is not pregnant. She  told her nurse that she was saturating 4 pads per hour since yesterday however I doubt that given that her she has no vital sign abnormality and reassuring hemoglobin and her bleeding is not so significant on exam. If she was truly having that degree of bleeding, I would expect that she would have tachycardia, hypotension, a drop in her hemoglobin level, chest pain, difficulty breathing or some other sign or symptom reflecting symptomatic anemia. She reports her pain is ongoing and similar to the pain she usually has. She has no ovary on the right secondary to oophorectomy and so there is no concern for torsion or tubo-ovarian abscess. She has no white blood cell count elevation or other GI complaints and I doubt that this represents appendicitis. She is symptomatically improved after a dose of IM Dilaudid. I discussed with her that she needs to follow-up with her OB/GYN as soon as possible, we discussed return precautions including bleeding precautions and she is comfortable with  the discharge plan. DC home. Initial O2 sat documented was 90% however this improved to 97% without any ER intervention and I suspect the initial reading was likely spurious. ____________________________________________   FINAL CLINICAL IMPRESSION(S) / ED DIAGNOSES  Final diagnoses:  Pelvic pain in female  Menstrual cramps  Dysmenorrhea      Gayla Doss, MD 08/03/15 1147  Gayla Doss, MD 08/03/15 1148  Gayla Doss, MD 08/03/15 1152

## 2015-08-03 NOTE — ED Notes (Signed)
Pt assisted with being cleaned up

## 2015-08-03 NOTE — ED Notes (Signed)
Pt and medicated scanned - but afterwards action was pended. Signed off as scanner broken

## 2015-09-07 HISTORY — PX: ABDOMINAL HYSTERECTOMY: SHX81

## 2015-09-24 ENCOUNTER — Ambulatory Visit: Payer: Medicaid Other | Attending: Obstetrics and Gynecology | Admitting: Physical Therapy

## 2015-09-24 DIAGNOSIS — M6281 Muscle weakness (generalized): Secondary | ICD-10-CM | POA: Insufficient documentation

## 2015-09-24 DIAGNOSIS — M25611 Stiffness of right shoulder, not elsewhere classified: Secondary | ICD-10-CM | POA: Insufficient documentation

## 2015-09-24 DIAGNOSIS — M436 Torticollis: Secondary | ICD-10-CM

## 2015-09-24 NOTE — Therapy (Signed)
[facility] [address] [place], [address] Phone: [phone]   Fax:  [phone]  Physical Therapy Evaluation  Patient Details  Name: Laryn Khilynn Borntreger MRN: [id number] Date of Birth: 10/30/1985 Referring Provider: Carles Collet  Encounter Date: 09/24/2015      PT End of Session - 09/24/15 0841    Visit Number 1   Number of Visits 12   Date for PT Re-Evaluation 12/17/15   PT Start Time 0730   PT Stop Time 0820   PT Time Calculation (min) 50 min   Activity Tolerance Patient tolerated treatment well;No increased pain   Behavior During Therapy [facility] for tasks assessed/performed      Past Medical History  Diagnosis Date  . Endometriosis     Past Surgical History  Procedure Laterality Date  . Abdominal hysterectomy    . Myomectomy Right     2012  . Oophorectomy Right 2012  . Cesarean section  2015    There were no vitals filed for this visit.       Subjective Assessment - 09/24/15 0739    Subjective Pt c/o B shoulder pain and weakness, R worse than L   Pertinent History Pt had hysterectomy September 05 2015. upon waking pt c/o significant pain and difficulty with use of B UE. L has improved, R is the same. Pt is unable to open things. unable to lift. Pain is worst at night and first thing in the morning. 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   Patient Stated Goals to improve ROM, assess nerves, improve strength.    Currently in Pain? Yes   Pain Score 7    Pain Location Arm   Pain Orientation Right            [facility] PT Assessment - 09/24/15 0001    Assessment   Medical Diagnosis acute pain of right shoulder   Referring Provider Schiff   Onset Date/Surgical Date 08/13/15   Hand Dominance Right   Next MD Visit 09/30/2015   Prior Therapy none for shoulder   Precautions   Precautions Other (comment)   lifting due to hysterectomy   Precaution Comments unable to lift >10#   Restrictions   Weight Bearing Restrictions No   Balance Screen   Has the patient fallen in the past 6 months No   Has the patient had a decrease in activity level because of a fear of falling?  No   Is the patient reluctant to leave their home because of a fear of falling?  No   Prior Function   Level of Independence Independent   Vocation Full time employment   Vocation Requirements standing, sitting, reaching over a counter.   Leisure sedentary (TV, reading)   Observation/Other Assessments   Observations FHP, internally rotated shoulders   Sensation   Light Touch Appears Intact   Posture/Postural Control   Posture Comments See observations   ROM / Strength   AROM / PROM / Strength AROM;PROM;Strength   AROM   Overall AROM Comments LUE AROM WNL. RUE 80 deg. elevation, IR WNL, ER painful and limited by 30 deg. Cervical R rotation reproduces shoulder pain, cervical extension limited and painful, all others WNL   PROM   Overall PROM Comments passively RUE with significantly improved pain indicating myofascial component of pain.   Strength   Overall  Strength Comments R grip 40#, L 65#. RUE 3/5 for all resisted tests though pt indicates this is due to fear of pain.   Palpation   Spinal mobility CPAs performed C5-T4, reproduction of pain with CPA at T2, T3.              Objective: Cervical retractions 3x10 with light end range holds. Reported decr. Stiffness in cervical spine but no change in measurable ROM.  Attempted arm swings to reduce tension but unable to perform in relaxed manner so discontinued.  RTB scapular retractions 3x10.   Extensive STM performed on R UT, LS, deltoid. Noted decr. Tone following this and pt reported pain improved to 5/10.  CPAs grade I x3 min T1-T3. Pt reported no change in pain but cervical rotation improved on R to=L with no pain in shoulder.                  PT Education - 09/24/15 0841    Education provided Yes   Education Details HEP, PT prognosis and diagnosis   Person(s) Educated Patient   Methods Explanation;Demonstration;Verbal cues   Comprehension Verbalized understanding;Returned demonstration             PT Long Term Goals - 09/24/15 0850    PT LONG TERM GOAL #1   Title Pt will improve pain free R shoulder elevation to 150 deg. to return to functional and work related tasks.   Baseline less than 100   Time 6   Period Weeks   Status New   PT LONG TERM GOAL #2   Title Pt will improve R grip strength from 40 to 60# to improve ability to cook and open jars at home   Baseline 40   Time 6   Period Weeks   Status New   PT LONG TERM GOAL #3   Title Pt will report pain no greater than 2/10 in R shoulder with sleeping.   Baseline 8/10 with sleeping   Time 6   Period Weeks   Status New               Plan - 09/24/15 1610    Clinical Impression Statement Pt is a pleasant 30 y/o female with c/o B shoulder pain, worse in R which started following hysterectomy. Pain has improved in L, more slowly in R and pt is limited in use of RUE. Currently RUE strength is 3/5, grip is 20# weaker than non-dominant hand. ROM is significantly limited in R UE however pt appears to have primary contributions from R peri-shoulder muscle pain as well as tightness/referred pain from cervical region. - for neuro symptoms other than weakness which appears to be pain related. Pt would benefit from skilled PT to address these issues.   Rehab Potential Good   Clinical Impairments Affecting Rehab Potential obesity, sedentary job/acuity of pain   PT Frequency 2x / week   PT Duration 6 weeks   PT Treatment/Interventions Aquatic Therapy;Dry needling;Therapeutic exercise;Manual techniques   PT Next Visit Plan STM, stretching, progression of exercise.   Consulted and Agree with Plan of Care Patient      Patient will benefit from skilled therapeutic  intervention in order to improve the following deficits and impairments:  Pain, Postural dysfunction, Improper body mechanics, Decreased range of motion, Decreased strength  Visit Diagnosis: Stiffness of right shoulder, not elsewhere classified - Plan: PT plan of care cert/re-cert  Stiffness of cervical spine - Plan: PT plan of care cert/re-cert  Muscle weakness (generalized) -  Plan: PT plan of care cert/re-cert     Problem List There are no active problems to display for this patient.   Fisher,Benjamin PT DPT 09/24/2015, 8:57 AM  [place] [facility] [address] [place], [place], [address] Phone: [phone]   Fax:  [phone]  Name: Danai Maryelizabeth KaufmannKevette Marrow MRN: [id number] Date of Birth: November 20, 1985

## 2015-09-24 NOTE — Therapy (Signed)
[facility] [address] [place], [place], [address] Phone: [phone]   Fax:  [phone]  Physical Therapy Treatment  Patient Details  Name: Chelsea Willis MRN: [id number] Date of Birth: 06/22/1985 Referring Provider: Carles ColletSchiff  Encounter Date: 09/24/2015      PT End of Session - 09/24/15 0841    Visit Number 1   Number of Visits 12   Date for PT Re-Evaluation 12/17/15   PT Start Time 0730   PT Stop Time 0820   PT Time Calculation (min) 50 min   Activity Tolerance Patient tolerated treatment well;No increased pain   [facility] assessed/performed      Past Medical History  Diagnosis Date  . Endometriosis     Past Surgical History  Procedure Laterality Date  . Abdominal hysterectomy    . Myomectomy Right     2012  . Oophorectomy Right 2012  . Cesarean section  2015    There were no vitals filed for this visit.      Subjective Assessment - 09/24/15 0739    Subjective Pt c/o B shoulder pain and weakness, R worse than L   Pertinent History Pt had hysterectomy September 05 2015. upon waking pt c/o significant pain and difficulty with use of B UE. L has improved, R is the same. Pt is unable to open things. unable to lift. Pain is worst at night and first thing in the morning. 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   Patient Stated Goals to improve ROM, assess nerves, improve strength.    Currently in Pain? Yes   Pain Score 7    Pain Location Arm   Pain Orientation Right            [facility] PT Assessment - 09/24/15 0001    Assessment   Medical Diagnosis acute pain of right shoulder   Referring Provider Schiff   Onset Date/Surgical Date 08/13/15   Hand Dominance Right   Next MD Visit 09/30/2015   Prior Therapy none for shoulder   Precautions   Precautions Other (comment)  lifting  due to hysterectomy   Precaution Comments unable to lift >10#   Restrictions   Weight Bearing Restrictions No   Balance Screen   Has the patient fallen in the past 6 months No   Has the patient had a decrease in activity level because of a fear of falling?  No   Is the patient reluctant to leave their home because of a fear of falling?  No   Prior Function   Level of Independence Independent   Vocation Full time employment   Vocation Requirements standing, sitting, reaching over a counter.   Leisure sedentary (TV, reading)   Observation/Other Assessments   Observations FHP, internally rotated shoulders   Sensation   Light Touch Appears Intact   Posture/Postural Control   Posture Comments See observations   ROM / Strength   AROM / PROM / Strength AROM;PROM;Strength   AROM   Overall AROM Comments LUE AROM WNL. RUE 80 deg. elevation, IR WNL, ER painful and limited by 30 deg. Cervical R rotation reproduces shoulder pain, cervical extension limited and painful, all others WNL   PROM   Overall PROM Comments passively RUE with significantly improved pain indicating myofascial component of pain.   Strength   Overall Strength  Comments R grip 40#, L 65#. RUE 3/5 for all resisted tests though pt indicates this is due to fear of pain.   Palpation   Spinal mobility CPAs performed C5-T4, reproduction of pain with CPA at T2, T3.                             PT Education - 09/24/15 0841    Education provided Yes   Education Details HEP, PT prognosis and diagnosis   Person(s) Educated Patient   Methods Explanation;Demonstration;Verbal cues   Comprehension Verbalized understanding;Returned demonstration             PT Long Term Goals - 09/24/15 0850    PT LONG TERM GOAL #1   Title Pt will improve pain free R shoulder elevation to 150 deg. to return to functional and work related tasks.   Baseline less than 100   Time 6   Period Weeks   Status New   PT LONG TERM  GOAL #2   Title Pt will improve R grip strength from 40 to 60# to improve ability to cook and open jars at home   Baseline 40   Time 6   Period Weeks   Status New   PT LONG TERM GOAL #3   Title Pt will report pain no greater than 2/10 in R shoulder with sleeping.   Baseline 8/10 with sleeping   Time 6   Period Weeks   Status New               Plan - 09/24/15 1610    Clinical Impression Statement Pt is a pleasant 30 y/o female with c/o B shoulder pain, worse in R which started following hysterectomy. Pain has improved in L, more slowly in R and pt is limited in use of RUE. Currently RUE strength is 3/5, grip is 20# weaker than non-dominant hand. ROM is significantly limited in R UE however pt appears to have primary contributions from R peri-shoulder muscle pain as well as tightness/referred pain from cervical region. - for neuro symptoms other than weakness which appears to be pain related. Pt would benefit from skilled PT to address these issues.   Rehab Potential Good   Clinical Impairments Affecting Rehab Potential obesity, sedentary job/acuity of pain   PT Frequency 2x / week   PT Duration 6 weeks   PT Treatment/Interventions Aquatic Therapy;Dry needling;Therapeutic exercise;Manual techniques   PT Next Visit Plan STM, stretching, progression of exercise.   Consulted and Agree with Plan of Care Patient      Patient will benefit from skilled therapeutic intervention in order to improve the following deficits and impairments:  Pain, Postural dysfunction, Improper body mechanics, Decreased range of motion, Decreased strength  Visit Diagnosis: Stiffness of right shoulder, not elsewhere classified - Plan: PT plan of care cert/re-cert  Stiffness of cervical spine - Plan: PT plan of care cert/re-cert  Muscle weakness (generalized) - Plan: PT plan of care cert/re-cert     Problem List There are no active problems to display for this patient.   Chelsea Willis 09/24/2015,  8:56 AM  [facility] PH[facility] AND SPORTS MEDICINE [address], [place], [address] Phone: [phone]   Fax:  [phone]  Name: Chelsea Willis MRN: [id number] Date of Birth: July 02, 1985

## 2015-09-26 ENCOUNTER — Encounter: Payer: BLUE CROSS/BLUE SHIELD | Admitting: Physical Therapy

## 2015-10-01 ENCOUNTER — Ambulatory Visit: Payer: BLUE CROSS/BLUE SHIELD | Admitting: Physical Therapy

## 2015-10-08 ENCOUNTER — Ambulatory Visit: Payer: Self-pay | Attending: Obstetrics and Gynecology | Admitting: Physical Therapy

## 2015-10-08 DIAGNOSIS — M436 Torticollis: Secondary | ICD-10-CM | POA: Insufficient documentation

## 2015-10-08 DIAGNOSIS — M25611 Stiffness of right shoulder, not elsewhere classified: Secondary | ICD-10-CM | POA: Insufficient documentation

## 2015-10-08 NOTE — Therapy (Signed)
[place] [facility] PH[facility]OR[facility] MEDICINE [address], [place], [address] Phone: [phone]   Fax:  [phone]  Physical Therapy Treatment  Patient Details  Name: Chelsea Willis MRN: [id number] Date of Birth: 10-Aug-1985 Referring Provider: Carles Collet  Encounter Date: 10/08/2015      PT End of Session - 10/08/15 0746    Visit Number 2   Number of Visits 12   Date for PT Re-Evaluation 12/17/15   PT Start Time 0735   PT Stop Time 0815   PT Time Calculation (min) 40 min   Activity Tolerance Patient tolerated treatment well;No increased pain   Behavior During Therapy [facility] for tasks assessed/performed      Past Medical History  Diagnosis Date  . Endometriosis     Past Surgical History  Procedure Laterality Date  . Abdominal hysterectomy    . Myomectomy Right     2012  . Oophorectomy Right 2012  . Cesarean section  2015    There were no vitals filed for this visit.      Subjective Assessment - 10/08/15 0743    Subjective Pt reports L shoulder has improved considerably. continues to have R shoulder pain primarily in the morning.   Pertinent History Pt had hysterectomy September 05 2015. upon waking pt c/o significant pain and difficulty with use of B UE. L has improved, R is the same. Pt is unable to open things. unable to lift. Pain is worst at night and first thing in the morning. 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   Patient Stated Goals to improve ROM, assess nerves, improve strength.    Currently in Pain? Yes   Pain Score 3    Pain Location Arm   Pain Orientation Right              Objective: Shoulder elevation in multiple planes. Initially 120 deg.   Cervical rotation, 50 deg. R, 55 deg. L with pain.  Extensive CPAs and UPAs grade I-II T1-T4 - most pain at T2. Following this pt reported incr. Pain.   Noted  improvement in cervical rotation to 60 deg. B with no pain following this, ncuing o change in shoulder elevation.  Performed cervical retractions, extensive education required for correct performance and to avoid pain.  Seated trap stretch 3x1 min each side.                   PT Education - 10/08/15 0745    Education provided Yes   Education Details HEP   Person(s) Educated Patient   Methods Explanation   Comprehension Verbalized understanding             PT Long Term Goals - 09/24/15 0850    PT LONG TERM GOAL #1   Title Pt will improve pain free R shoulder elevation to 150 deg. to return to functional and work related tasks.   Baseline less than 100   Time 6   Period Weeks   Status New   PT LONG TERM GOAL #2   Title Pt will improve R grip strength from 40 to 60# to improve ability to cook and open jars at home   Baseline 40   Time 6   Period Weeks   Status New   PT LONG TERM GOAL #3   Title Pt will report pain no greater than  2/10 in R shoulder with sleeping.   Baseline 8/10 with sleeping   Time 6   Period Weeks   Status New               Plan - 10/08/15 [id number]    Clinical Impression Statement Pt has made progress in L shoulder pain, R shoulder ROM, and R shoulder pain, continues to have some R shoulder pain, limited R shoulder ROM and limited thoracic spine mobility. Pt would continue to benefit from skilled PT to address these issues.   Rehab Potential Good   Clinical Impairments Affecting Rehab Potential obesity, sedentary job/acuity of pain   PT Frequency 2x / week   PT Duration 6 weeks   PT Treatment/Interventions Aquatic Therapy;Dry needling;Therapeutic exercise;Manual techniques   PT Next Visit Plan STM, stretching, progression of exercise.   Consulted and Agree with Plan of Care Patient      Patient will benefit from skilled therapeutic intervention in order to improve the following deficits and impairments:  Pain, Postural  dysfunction, Improper body mechanics, Decreased range of motion, Decreased strength  Visit Diagnosis: Stiffness of right shoulder, not elsewhere classified  Stiffness of cervical spine     Problem List There are no active problems to display for this patient.   Uniqua Kihn PT DPT 10/08/2015, 8:38 AM  [place] [facility]ICINE [address] [place], [place], [id number] Phone: [phone]   Fax:  [phone]  Name: Chelsea Willis MRN: [id number] Date of Birth: [birth year]

## 2015-10-10 ENCOUNTER — Encounter: Payer: BLUE CROSS/BLUE SHIELD | Admitting: Physical Therapy

## 2015-10-15 ENCOUNTER — Ambulatory Visit: Payer: Medicaid Other | Admitting: Physical Therapy

## 2015-10-17 ENCOUNTER — Ambulatory Visit: Payer: Medicaid Other | Admitting: Physical Therapy

## 2015-10-22 ENCOUNTER — Ambulatory Visit: Payer: Medicaid Other | Admitting: Physical Therapy

## 2015-10-24 ENCOUNTER — Ambulatory Visit: Payer: Medicaid Other | Admitting: Physical Therapy

## 2015-10-29 ENCOUNTER — Ambulatory Visit: Payer: Medicaid Other | Admitting: Physical Therapy

## 2015-11-05 ENCOUNTER — Ambulatory Visit: Payer: Medicaid Other | Admitting: Physical Therapy

## 2015-11-07 ENCOUNTER — Encounter: Payer: BLUE CROSS/BLUE SHIELD | Admitting: Physical Therapy

## 2015-11-12 ENCOUNTER — Encounter: Payer: BLUE CROSS/BLUE SHIELD | Admitting: Physical Therapy

## 2015-11-14 ENCOUNTER — Encounter: Payer: BLUE CROSS/BLUE SHIELD | Admitting: Physical Therapy

## 2015-12-16 ENCOUNTER — Encounter: Payer: Self-pay | Admitting: Internal Medicine

## 2015-12-16 ENCOUNTER — Ambulatory Visit (INDEPENDENT_AMBULATORY_CARE_PROVIDER_SITE_OTHER): Payer: BLUE CROSS/BLUE SHIELD | Admitting: Internal Medicine

## 2015-12-16 DIAGNOSIS — R2230 Localized swelling, mass and lump, unspecified upper limb: Secondary | ICD-10-CM | POA: Insufficient documentation

## 2015-12-16 DIAGNOSIS — R2232 Localized swelling, mass and lump, left upper limb: Secondary | ICD-10-CM | POA: Diagnosis not present

## 2015-12-16 DIAGNOSIS — Z90710 Acquired absence of both cervix and uterus: Secondary | ICD-10-CM

## 2015-12-16 DIAGNOSIS — F0630 Mood disorder due to known physiological condition, unspecified: Secondary | ICD-10-CM

## 2015-12-16 NOTE — Progress Notes (Signed)
Date:  12/16/2015   Name:  Chelsea Willis   DOB:  [birth year]   MRN:  [id number]   Chief Complaint: Establish Care; Depression; Anxiety; and Weight Loss Depression        This is a new problem.  The onset quality is gradual.   Associated symptoms include no fatigue and no appetite change.  Past treatments include nothing.  Compliance with prior treatments: She is not interested in beginning medications at this time. Obesity - concerned about ongoing weight gain - about 15 lbs since march. She has tried low carb diet, cut out sodas and not eating late.  She has lost about 6 lbs. To assist in weight loss I suggest a diet absent of all simple carbs and beginning a regular exercise program. I suggest that she can walk 10-15 minutes before work and then 15 minutes during her lunch hour daily. Recent Hysterectomy - patient is still recovering from a hysterectomy done in March at [facility] for endometriosis. On review she's now had a complete hysterectomy except for a remaining left ovary. She still a little bit fatigued is not started a significant exercise program at this point. She is back to work. She has 2 small children at home that she has take care of after work. Mass - Patient has noted a mass under her left axilla. She does notice while washing. It has not been tender. There's been no trauma.  Review of Systems  Constitutional: Negative for fever, appetite change and fatigue.  Respiratory: Negative for cough, chest tightness and shortness of breath.   Cardiovascular: Negative for chest pain, palpitations and leg swelling.  Skin:       Lump in left axilla  Psychiatric/Behavioral: Positive for depression and dysphoric mood. Negative for hallucinations and sleep disturbance. The patient is not nervous/anxious.     Patient Active Problem List   Diagnosis Date Noted  . Intracervical pessary 06/05/2014  . Encounter for routine postpartum follow-up 04/25/2014  . Anemia in pregnancy  11/22/2013  . History of surgical procedure 11/22/2013  . Uterine scar from previous surgery affecting pregnancy 11/22/2013  . Morbid obesity (HCC) 11/03/2013    Prior to Admission medications   Medication Sig Start Date End Date Taking? Authorizing Provider  Multiple Vitamins-Minerals (MULTIVITAMIN WITH MINERALS) tablet Take 1 tablet by mouth daily.   Yes Historical Provider, MD    Allergies  Allergen Reactions  . Hydrocodone-Acetaminophen Nausea And Vomiting    Past Surgical History  Procedure Laterality Date  . Abdominal hysterectomy  09/2015    with Left OOP  . Myomectomy Right     2012  . Oophorectomy Right 2012  . Cesarean section  2015    Social History  Substance Use Topics  . Smoking status: Never Smoker   . Smokeless tobacco: None  . Alcohol Use: No     Medication list has been reviewed and updated.   Physical Exam  Constitutional: She is oriented to person, place, and time. She appears well-developed. No distress.  HENT:  Head: Normocephalic and atraumatic.  Neck: Normal range of motion. Neck supple. No thyromegaly present.  Cardiovascular: Normal rate, regular rhythm and normal heart sounds.   Pulmonary/Chest: Effort normal and breath sounds normal. No respiratory distress. She has no wheezes.  Musculoskeletal: She exhibits no edema or tenderness.  Neurological: She is alert and oriented to person, place, and time.  Skin: Skin is warm and dry. No rash noted.  In the skin under the left axilla is  a mobile, non tender cutaneous mass with slight bluish tint to over lying skin suggestive of a prominent vein.  Psychiatric: She has a normal mood and affect. Her speech is normal and behavior is normal. Judgment and thought content normal. Cognition and memory are normal.  Nursing note and vitals reviewed.   BP 124/82 mmHg  Pulse 87  Resp 16  Ht 5\' 5"  (1.651 m)  Wt 369 lb (167.377 kg)  BMI 61.40 kg/m2  SpO2 97%  LMP 08/03/2015  Assessment and Plan: 1.  Morbid obesity, unspecified obesity type (HCC) Recommended regular daily exercise Begin diet of lean meats/fish, non-starchy vegetables and fruits  2. S/P laparoscopic hysterectomy Still in recovery  3. 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 Mass of axilla, left Reassured - appears to be a prominent vein Patient reassured   Bari EdwardLaura Berglund, MD [facility]  12/16/2015

## 2015-12-16 NOTE — Patient Instructions (Signed)
DASH Eating Plan  DASH stands for "Dietary Approaches to Stop Hypertension." The DASH eating plan is a healthy eating plan that has been shown to reduce high blood pressure (hypertension). Additional health benefits may include reducing the risk of type 2 diabetes mellitus, heart disease, and stroke. The DASH eating plan may also help with weight loss.  WHAT DO I NEED TO KNOW ABOUT THE DASH EATING PLAN?  For the DASH eating plan, you will follow these general guidelines:  · Choose foods with a percent daily value for sodium of less than 5% (as listed on the food label).  · Use salt-free seasonings or herbs instead of table salt or sea salt.  · Check with your health care provider or pharmacist before using salt substitutes.  · Eat lower-sodium products, often labeled as "lower sodium" or "no salt added."  · Eat fresh foods.  · Eat more vegetables, fruits, and low-fat dairy products.  · Choose whole grains. Look for the word "whole" as the first word in the ingredient list.  · Choose fish and skinless chicken or turkey more often than red meat. Limit fish, poultry, and meat to 6 oz (170 g) each day.  · Limit sweets, desserts, sugars, and sugary drinks.  · Choose heart-healthy fats.  · Limit cheese to 1 oz (28 g) per day.  · Eat more home-cooked food and less restaurant, buffet, and fast food.  · Limit fried foods.  · Cook foods using methods other than frying.  · Limit canned vegetables. If you do use them, rinse them well to decrease the sodium.  · When eating at a restaurant, ask that your food be prepared with less salt, or no salt if possible.  WHAT FOODS CAN I EAT?  Seek help from a dietitian for individual calorie needs.  Grains  Whole grain or whole wheat bread. Brown rice. Whole grain or whole wheat pasta. Quinoa, bulgur, and whole grain cereals. Low-sodium cereals. Corn or whole wheat flour tortillas. Whole grain cornbread. Whole grain crackers. Low-sodium crackers.  Vegetables  Fresh or frozen vegetables  (raw, steamed, roasted, or grilled). Low-sodium or reduced-sodium tomato and vegetable juices. Low-sodium or reduced-sodium tomato sauce and paste. Low-sodium or reduced-sodium canned vegetables.   Fruits  All fresh, canned (in natural juice), or frozen fruits.  Meat and Other Protein Products  Ground beef (85% or leaner), grass-fed beef, or beef trimmed of fat. Skinless chicken or turkey. Ground chicken or turkey. Pork trimmed of fat. All fish and seafood. Eggs. Dried beans, peas, or lentils. Unsalted nuts and seeds. Unsalted canned beans.  Dairy  Low-fat dairy products, such as skim or 1% milk, 2% or reduced-fat cheeses, low-fat ricotta or cottage cheese, or plain low-fat yogurt. Low-sodium or reduced-sodium cheeses.  Fats and Oils  Tub margarines without trans fats. Light or reduced-fat mayonnaise and salad dressings (reduced sodium). Avocado. Safflower, olive, or canola oils. Natural peanut or almond butter.  Other  Unsalted popcorn and pretzels.  The items listed above may not be a complete list of recommended foods or beverages. Contact your dietitian for more options.  WHAT FOODS ARE NOT RECOMMENDED?  Grains  White bread. White pasta. White rice. Refined cornbread. Bagels and croissants. Crackers that contain trans fat.  Vegetables  Creamed or fried vegetables. Vegetables in a cheese sauce. Regular canned vegetables. Regular canned tomato sauce and paste. Regular tomato and vegetable juices.  Fruits  Dried fruits. Canned fruit in light or heavy syrup. Fruit juice.  Meat and Other Protein   Products  Fatty cuts of meat. Ribs, chicken wings, bacon, sausage, bologna, salami, chitterlings, fatback, hot dogs, bratwurst, and packaged luncheon meats. Salted nuts and seeds. Canned beans with salt.  Dairy  Whole or 2% milk, cream, half-and-half, and cream cheese. Whole-fat or sweetened yogurt. Full-fat cheeses or blue cheese. Nondairy creamers and whipped toppings. Processed cheese, cheese spreads, or cheese  curds.  Condiments  Onion and garlic salt, seasoned salt, table salt, and sea salt. Canned and packaged gravies. Worcestershire sauce. Tartar sauce. Barbecue sauce. Teriyaki sauce. Soy sauce, including reduced sodium. Steak sauce. Fish sauce. Oyster sauce. Cocktail sauce. Horseradish. Ketchup and mustard. Meat flavorings and tenderizers. Bouillon cubes. Hot sauce. Tabasco sauce. Marinades. Taco seasonings. Relishes.  Fats and Oils  Butter, stick margarine, lard, shortening, ghee, and bacon fat. Coconut, palm kernel, or palm oils. Regular salad dressings.  Other  Pickles and olives. Salted popcorn and pretzels.  The items listed above may not be a complete list of foods and beverages to avoid. Contact your dietitian for more information.  WHERE CAN I FIND MORE INFORMATION?  National Heart, Lung, and Blood Institute: www.nhlbi.nih.gov/health/health-topics/topics/dash/     This information is not intended to replace advice given to you by your health care provider. Make sure you discuss any questions you have with your health care provider.     Document Released: 05/14/2011 Document Revised: 06/15/2014 Document Reviewed: 03/29/2013  Elsevier Interactive Patient Education ©2016 Elsevier Inc.

## 2016-01-23 ENCOUNTER — Encounter: Payer: Self-pay | Admitting: Internal Medicine

## 2016-01-23 ENCOUNTER — Ambulatory Visit (INDEPENDENT_AMBULATORY_CARE_PROVIDER_SITE_OTHER): Payer: BLUE CROSS/BLUE SHIELD | Admitting: Internal Medicine

## 2016-01-23 VITALS — BP 138/86 | HR 76 | Resp 16 | Ht 64.0 in | Wt 371.0 lb

## 2016-01-23 DIAGNOSIS — G479 Sleep disorder, unspecified: Secondary | ICD-10-CM

## 2016-01-23 NOTE — Progress Notes (Signed)
    Date:  01/23/2016   Name:  Chelsea Willis   DOB:  02/08/1986   MRN:  [id number]   Chief Complaint: Sleep Apnea (Snoring and tired all day. Thinks she may have OSA. Wants Sleep Study. )  HPI Patient has severe snoring for a while.  Her mother spent the night with her and noticed that she stops breathing.  She is also sleepy during the day.  She has fallen asleep during the day  - in a meeting.  She also has morning headaches.  Review of Systems  Constitutional: Positive for fatigue.  Respiratory: Negative for chest tightness, shortness of breath and wheezing.   Cardiovascular: Negative for chest pain and palpitations.  Neurological: Positive for headaches. Negative for dizziness.  Psychiatric/Behavioral: Positive for sleep disturbance.    Patient Active Problem List   Diagnosis Date Noted  . Mass of axilla 12/16/2015  . S/P laparoscopic hysterectomy 12/16/2015  . Morbid obesity (HCC) 11/03/2013    Prior to Admission medications   Medication Sig Start Date End Date Taking? Authorizing Provider  Multiple Vitamins-Minerals (MULTIVITAMIN WITH MINERALS) tablet Take 1 tablet by mouth daily.    Historical Provider, MD    Allergies  Allergen Reactions  . Hydrocodone-Acetaminophen Nausea And Vomiting    Past Surgical History:  Procedure Laterality Date  . ABDOMINAL HYSTERECTOMY  09/2015   with left salpingectomy  . CESAREAN SECTION  2015  . MYOMECTOMY Right    2012  . OOPHORECTOMY Right 2012    Social History  Substance Use Topics  . Smoking status: Never Smoker  . Smokeless tobacco: Never Used  . Alcohol use No    Medication list has been reviewed and updated.   Physical Exam  Constitutional: She is oriented to person, place, and time. She appears well-developed. No distress.  HENT:  Head: Normocephalic and atraumatic.  Mouth/Throat: Uvula is midline and oropharynx is clear and moist.  Cardiovascular: Normal rate, regular rhythm and normal heart sounds.     Pulmonary/Chest: Effort normal and breath sounds normal. No respiratory distress.  Musculoskeletal: Normal range of motion.  Neurological: She is alert and oriented to person, place, and time.  Skin: Skin is warm and dry. No rash noted.  Psychiatric: She has a normal mood and affect. Her behavior is normal. Thought content normal.  Nursing note and vitals reviewed.   BP 138/86 (BP Location: Right Arm, Patient Position: Sitting, Cuff Size: Large)   Pulse 76   Resp 16   Ht 5\' 4"  (1.626 m)   Wt (!) 371 lb (168.3 kg)   LMP 08/03/2015   SpO2 98%   BMI 63.68 kg/m   Assessment and Plan: 1. Sleep disorder Patient will call back with covered sleep study center and supplier.   Chelsea EdwardLaura Remedios Mckone, MD [facility]  01/23/2016

## 2016-01-23 NOTE — Patient Instructions (Addendum)
Call insurance to find out 1) covered Sleep study centers   2) covered medical supplies of CPAP supplies

## 2016-10-13 ENCOUNTER — Encounter: Payer: Self-pay | Admitting: Internal Medicine

## 2016-10-13 ENCOUNTER — Ambulatory Visit (INDEPENDENT_AMBULATORY_CARE_PROVIDER_SITE_OTHER): Payer: BLUE CROSS/BLUE SHIELD | Admitting: Internal Medicine

## 2016-10-13 VITALS — BP 138/84 | HR 82 | Temp 98.1°F | Ht 64.0 in | Wt 378.0 lb

## 2016-10-13 DIAGNOSIS — J0100 Acute maxillary sinusitis, unspecified: Secondary | ICD-10-CM

## 2016-10-13 MED ORDER — AMOXICILLIN-POT CLAVULANATE 875-125 MG PO TABS: 1.0000 | ORAL_TABLET | Freq: Two times a day (BID) | ORAL | 0 refills | Status: DC

## 2016-10-13 MED ORDER — GUAIFENESIN-CODEINE 100-10 MG/5ML PO SYRP: 5.0000 mL | ORAL_SOLUTION | Freq: Three times a day (TID) | ORAL | 0 refills | Status: DC | PRN

## 2016-10-13 NOTE — Progress Notes (Signed)
Date:  10/13/2016   Name:  Chelsea Willis   DOB:  May 11, 1986   MRN:  [id number]   Chief Complaint: Cough (Congestion, nose running, and headache. Congestion in head, and facial pressure. X  4 days. Unknown if had fever. Yellow production. ) Cough  This is a new problem. The current episode started in the past 7 days. The problem has been gradually worsening. The cough is productive of sputum. Associated symptoms include nasal congestion, rhinorrhea, a sore throat and wheezing. Pertinent negatives include no chest pain, chills or fever.    Review of Systems  Constitutional: Negative for chills, fatigue and fever.  HENT: Positive for congestion, rhinorrhea, sinus pain, sinus pressure and sore throat.   Respiratory: Positive for cough and wheezing. Negative for chest tightness.   Cardiovascular: Negative for chest pain and palpitations.  Gastrointestinal: Negative for diarrhea and vomiting.  Musculoskeletal: Negative for arthralgias.    Patient Active Problem List   Diagnosis Date Noted  . Mass of axilla 12/16/2015  . S/P laparoscopic hysterectomy 12/16/2015  . Morbid obesity (HCC) 11/03/2013    Prior to Admission medications   Medication Sig Start Date End Date Taking? Authorizing Provider  acetaminophen (TYLENOL) 325 MG tablet 1 tab q6 hours scheduled for first 72 hours then q6 hours prn pain 09/06/15  Yes [provider]  ibuprofen (ADVIL,MOTRIN) 600 MG tablet Take 600 mg by mouth. 08/03/15  Yes [provider]  Multiple Vitamins-Minerals (MULTIVITAMIN WITH MINERALS) tablet Take by mouth.   Yes [provider]    Allergies  Allergen Reactions  . Hydrocodone-Acetaminophen Nausea And Vomiting    Past Surgical History:  Procedure Laterality Date  . ABDOMINAL HYSTERECTOMY  09/2015   with left salpingectomy  . CESAREAN SECTION  2015  . MYOMECTOMY Right    2012  . OOPHORECTOMY Right 2012    Social History  Substance Use Topics  . Smoking  status: Never Smoker  . Smokeless tobacco: Never Used  . Alcohol use No    Medication list has been reviewed and updated.   Physical Exam  Constitutional: She is oriented to person, place, and time. She appears well-developed and well-nourished.  HENT:  Right Ear: External ear and ear canal normal. Tympanic membrane is not erythematous and not retracted.  Left Ear: External ear and ear canal normal. Tympanic membrane is not erythematous and not retracted.  Nose: Right sinus exhibits maxillary sinus tenderness and frontal sinus tenderness. Left sinus exhibits maxillary sinus tenderness and frontal sinus tenderness.  Mouth/Throat: Uvula is midline and mucous membranes are normal. No oral lesions. Posterior oropharyngeal erythema present. No oropharyngeal exudate.  Cardiovascular: Normal rate, regular rhythm and normal heart sounds.   Pulmonary/Chest: Breath sounds normal. She has no wheezes. She has no rales.  Lymphadenopathy:    She has no cervical adenopathy.  Neurological: She is alert and oriented to person, place, and time.    BP 138/84 (BP Location: Right Wrist, Patient Position: Sitting, Cuff Size: Normal)   Pulse 82   Temp 98.1 F (36.7 C)   Ht 5\' 4"  (1.626 m)   Wt (!) 378 lb (171.5 kg)   LMP 08/03/2015 Comment: fibroids and endometriosis   SpO2 97%   BMI 64.88 kg/m   Assessment and Plan: 1. Acute maxillary sinusitis, recurrence not specified Continue fluids and advil cold & sinus - amoxicillin-clavulanate (AUGMENTIN) 875-125 MG tablet; Take 1 tablet by mouth 2 (two) times daily.  Dispense: 20 tablet; Refill: 0   Meds ordered this  encounter  Medications  . amoxicillin-clavulanate (AUGMENTIN) 875-125 MG tablet    Sig: Take 1 tablet by mouth 2 (two) times daily.    Dispense:  20 tablet    Refill:  0  . guaiFENesin-codeine (ROBITUSSIN AC) 100-10 MG/5ML syrup    Sig: Take 5 mLs by mouth 3 (three) times daily as needed for cough.    Dispense:  150 mL    Refill:  0     Bari EdwardLaura Berglund, MD [facility]  10/13/2016

## 2016-10-13 NOTE — Patient Instructions (Signed)

## 2017-06-09 ENCOUNTER — Ambulatory Visit (INDEPENDENT_AMBULATORY_CARE_PROVIDER_SITE_OTHER): Payer: BLUE CROSS/BLUE SHIELD | Admitting: Internal Medicine

## 2017-06-09 ENCOUNTER — Encounter: Payer: Self-pay | Admitting: Internal Medicine

## 2017-06-09 VITALS — BP 122/68 | HR 121 | Temp 98.2°F | Ht 64.0 in | Wt 378.0 lb

## 2017-06-09 DIAGNOSIS — J209 Acute bronchitis, unspecified: Secondary | ICD-10-CM | POA: Diagnosis not present

## 2017-06-09 MED ORDER — ALBUTEROL SULFATE HFA 108 (90 BASE) MCG/ACT IN AERS: 2.0000 | INHALATION_SPRAY | Freq: Four times a day (QID) | RESPIRATORY_TRACT | 0 refills | Status: DC | PRN

## 2017-06-09 MED ORDER — CEFDINIR 300 MG PO CAPS: 300.0000 mg | ORAL_CAPSULE | Freq: Two times a day (BID) | ORAL | 0 refills | Status: DC

## 2017-06-09 MED ORDER — PREDNISONE 10 MG PO TABS: ORAL_TABLET | ORAL | 0 refills | Status: DC

## 2017-06-09 MED ORDER — ALBUTEROL SULFATE (2.5 MG/3ML) 0.083% IN NEBU: 2.5000 mg | INHALATION_SOLUTION | Freq: Once | RESPIRATORY_TRACT | Status: DC

## 2017-06-09 NOTE — Progress Notes (Signed)
Date:  06/09/2017   Name:  Chelsea Willis   DOB:  12-01-1985   MRN:  [id number]   Chief Complaint: Cough (Fever, cough, headache- started a week ago. Nothing will come out of chest. Taken mucinex, sinus pressure meds, BC day cold, and tylenol cold/flu. 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 The current episode started in the past 7 days. The problem has been unchanged. The maximum temperature recorded prior to her arrival was 100.4 - 100.9 F. Associated symptoms include coughing, a sore throat and wheezing. Pertinent negatives include no abdominal pain, chest pain, congestion, headaches or sinus pain. She has tried acetaminophen, decongestant, antihistamine and NSAIDs for the symptoms. The treatment provided no relief.     Review of Systems  Constitutional: Positive for fever. Negative for chills and diaphoresis.  HENT: Positive for sore throat. Negative for congestion, sinus pain, trouble swallowing and voice change.   Respiratory: Positive for cough, shortness of breath and wheezing. Negative for choking and chest tightness.   Cardiovascular: Negative for chest pain and palpitations.  Gastrointestinal: Negative for abdominal pain.  Allergic/Immunologic: Negative for environmental allergies.  Neurological: Negative for dizziness and headaches.    Patient Active Problem List   Diagnosis Date Noted  . Mass of axilla 12/16/2015  . S/P laparoscopic hysterectomy 12/16/2015  . Morbid obesity (HCC) 11/03/2013    Prior to Admission medications   Medication Sig Start Date End Date Taking? Authorizing Provider  acetaminophen (TYLENOL) 325 MG tablet 1 tab q6 hours scheduled for first 72 hours then q6 hours prn pain 09/06/15  Yes [provider]  ibuprofen (ADVIL,MOTRIN) 600 MG tablet Take 600 mg by mouth. 08/03/15  Yes [provider]  Multiple Vitamins-Minerals (MULTIVITAMIN WITH  MINERALS) tablet Take by mouth.   Yes [provider]    Allergies  Allergen Reactions  . Hydrocodone-Acetaminophen Nausea And Vomiting    Past Surgical History:  Procedure Laterality Date  . ABDOMINAL HYSTERECTOMY  09/2015   with left salpingectomy  . CESAREAN SECTION  2015  . MYOMECTOMY Right    2012  . OOPHORECTOMY Right 2012    Social History   Tobacco Use  . Smoking status: Never Smoker  . Smokeless tobacco: Never Used  Substance Use Topics  . Alcohol use: No  . Drug use: No     Medication list has been reviewed and updated.  PHQ 2/9 Scores 06/09/2017 12/16/2015  PHQ - 2 Score 0 2  PHQ- 9 Score - 10    Physical Exam  Constitutional: She is oriented to person, place, and time. She appears well-developed. No distress.  HENT:  Head: Normocephalic and atraumatic.  Cardiovascular: Normal rate, regular rhythm and normal heart sounds.  Pulmonary/Chest: Effort normal. No accessory muscle usage. No respiratory distress. She has decreased breath sounds. She has wheezes in the right upper field, the right middle field, the right lower field, the left upper field, the left middle field and the left lower field. She has no rhonchi. She has no rales.  After albuterol nebulizer - improved air movement and subjective reduction in work of breathing.  Musculoskeletal: Normal range of motion.  Neurological: She is alert and oriented to person, place, and time.  Skin: Skin is warm and dry. No rash noted.  Psychiatric: She has a normal mood and affect. Her speech is normal and behavior is normal. Thought content  normal.  Nursing note and vitals reviewed.   BP 122/68   Pulse (!) 121   Temp 98.2 F (36.8 C) (Oral)   Ht 5\' 4"  (1.626 m)   Wt (!) 378 lb (171.5 kg)   LMP 08/03/2015 Comment: fibroids and endometriosis   SpO2 94%   BMI 64.88 kg/m   Assessment and Plan: 1. Bronchospasm with bronchitis, acute Note given to return to work 06/14/17. - albuterol (PROVENTIL) (2.5  MG/3ML) 0.083% nebulizer solution 2.5 mg - cefdinir (OMNICEF) 300 MG capsule; Take 1 capsule (300 mg total) by mouth 2 (two) times daily.  Dispense: 20 capsule; Refill: 0 - albuterol (PROVENTIL HFA;VENTOLIN HFA) 108 (90 Base) MCG/ACT inhaler; Inhale 2 puffs into the lungs every 6 (six) hours as needed for wheezing or shortness of breath.  Dispense: 1 Inhaler; Refill: 0 - predniSONE (DELTASONE) 10 MG tablet; Take 6 on day 1and 2, 5 on day 3 and 4, 4 on day 5 and 6 , 3 on day 7 and 8, 2 on day 9 and 10 and 1 on day 11 and 12 then stop.  Dispense: 42 tablet; Refill: 0   Meds ordered this encounter  Medications  . albuterol (PROVENTIL) (2.5 MG/3ML) 0.083% nebulizer solution 2.5 mg  . cefdinir (OMNICEF) 300 MG capsule    Sig: Take 1 capsule (300 mg total) by mouth 2 (two) times daily.    Dispense:  20 capsule    Refill:  0  . albuterol (PROVENTIL HFA;VENTOLIN HFA) 108 (90 Base) MCG/ACT inhaler    Sig: Inhale 2 puffs into the lungs every 6 (six) hours as needed for wheezing or shortness of breath.    Dispense:  1 Inhaler    Refill:  0  . predniSONE (DELTASONE) 10 MG tablet    Sig: Take 6 on day 1and 2, 5 on day 3 and 4, 4 on day 5 and 6 , 3 on day 7 and 8, 2 on day 9 and 10 and 1 on day 11 and 12 then stop.    Dispense:  42 tablet    Refill:  0    Partially dictated using Animal nutritionistDragon software. Any errors are unintentional.  Bari EdwardLaura Awanda Wilcock, MD [facility]  06/09/2017

## 2017-06-09 NOTE — Patient Instructions (Signed)
Continue lots of fluids, rest, inhaler every 4 hours if needed for chest tightness/wheezing/shortness of breath

## 2017-06-28 ENCOUNTER — Ambulatory Visit: Payer: BLUE CROSS/BLUE SHIELD | Admitting: Internal Medicine

## 2017-06-30 ENCOUNTER — Ambulatory Visit: Payer: BLUE CROSS/BLUE SHIELD | Admitting: Internal Medicine

## 2017-07-26 ENCOUNTER — Encounter: Payer: Self-pay | Admitting: Internal Medicine

## 2017-07-26 ENCOUNTER — Ambulatory Visit (INDEPENDENT_AMBULATORY_CARE_PROVIDER_SITE_OTHER): Payer: BLUE CROSS/BLUE SHIELD | Admitting: Internal Medicine

## 2017-07-26 VITALS — BP 138/84 | HR 98 | Resp 16 | Ht 64.0 in | Wt >= 6400 oz

## 2017-07-26 DIAGNOSIS — G4730 Sleep apnea, unspecified: Secondary | ICD-10-CM | POA: Diagnosis not present

## 2017-07-26 NOTE — Progress Notes (Signed)
Date:  07/26/2017   Name:  Chelsea Willis   DOB:  Sep 29, 1985   MRN:  [id number]   Chief Complaint: Sleep Apnea (Falls asleep many times a day and did today in car. Sleeps about 4 - 5 hours.  Does have interrupted sleep but is able to go back to sleep once she tries to.  )  She often wakes up with severe headaches which improve with otc medications. She continues to snore.  She wakes up feeling tired.  She can fall asleep whenever she gets still.  The problem is getting worse and her family is noticing it too. She is having mood changes as well. She was advised to call back with sleep study center on her insurance last august but never did.   Review of Systems  Constitutional: Positive for fatigue. Negative for chills, fever and unexpected weight change.  HENT: Negative for trouble swallowing.   Eyes: Negative for visual disturbance.  Respiratory: Negative for cough, chest tightness and shortness of breath.   Cardiovascular: Negative for chest pain, palpitations and leg swelling.  Neurological: Positive for headaches. Negative for dizziness, seizures and syncope.  Hematological: Negative for adenopathy.  Psychiatric/Behavioral: Positive for dysphoric mood and sleep disturbance.    Patient Active Problem List   Diagnosis Date Noted  . Mass of axilla 12/16/2015  . S/P laparoscopic hysterectomy 12/16/2015  . Morbid obesity (HCC) 11/03/2013    Prior to Admission medications   Medication Sig Start Date End Date Taking? Authorizing Provider  acetaminophen (TYLENOL) 325 MG tablet 1 tab q6 hours scheduled for first 72 hours then q6 hours prn pain 09/06/15  Yes [provider]  Multiple Vitamins-Minerals (MULTIVITAMIN WITH MINERALS) tablet Take by mouth.   Yes [provider]  albuterol (PROVENTIL HFA;VENTOLIN HFA) 108 (90 Base) MCG/ACT inhaler Inhale 2 puffs into the lungs every 6 (six) hours as needed for wheezing or shortness of breath. Patient not taking:  Reported on 07/26/2017 06/09/17   Reubin MilanBerglund, Kregg Cihlar H, MD    Allergies  Allergen Reactions  . Hydrocodone-Acetaminophen Nausea And Vomiting    Past Surgical History:  Procedure Laterality Date  . ABDOMINAL HYSTERECTOMY  09/2015   with left salpingectomy  . CESAREAN SECTION  2015  . MYOMECTOMY Right    2012  . OOPHORECTOMY Right 2012    Social History   Tobacco Use  . Smoking status: Never Smoker  . Smokeless tobacco: Never Used  Substance Use Topics  . Alcohol use: No  . Drug use: No     Medication list has been reviewed and updated.  PHQ 2/9 Scores 06/09/2017 12/16/2015  PHQ - 2 Score 0 2  PHQ- 9 Score - 10    Physical Exam  Constitutional: She is oriented to person, place, and time. She appears well-developed. No distress.  HENT:  Head: Normocephalic and atraumatic.  Cardiovascular: Normal rate, regular rhythm and normal heart sounds.  Pulmonary/Chest: Effort normal and breath sounds normal. No respiratory distress. She has no wheezes.  Musculoskeletal: Normal range of motion. She exhibits no edema or tenderness.  Neurological: She is alert and oriented to person, place, and time.  Skin: Skin is warm and dry. No rash noted.  Psychiatric: She has a normal mood and affect. Her behavior is normal. Thought content normal.  Nursing note and vitals reviewed.   BP 138/84   Pulse 98   Resp 16   Ht 5\' 4"  (1.626 m)   Wt (!) 411 lb (186.4 kg)   LMP  08/03/2015 Comment: fibroids and endometriosis   SpO2 93%   BMI 70.55 kg/m   Assessment and Plan: 1. Sleep disorder breathing - Nocturnal polysomnography (NPSG); Future   No orders of the defined types were placed in this encounter.   Partially dictated using Animal nutritionist. Any errors are unintentional.  Bari Edward, MD [facility]  07/26/2017

## 2017-08-02 ENCOUNTER — Ambulatory Visit: Payer: BLUE CROSS/BLUE SHIELD | Admitting: Internal Medicine

## 2017-09-02 ENCOUNTER — Ambulatory Visit: Payer: BLUE CROSS/BLUE SHIELD | Attending: Neurology

## 2017-09-02 DIAGNOSIS — G4733 Obstructive sleep apnea (adult) (pediatric): Secondary | ICD-10-CM | POA: Insufficient documentation

## 2017-09-07 ENCOUNTER — Other Ambulatory Visit: Payer: Self-pay | Admitting: Internal Medicine

## 2017-09-07 DIAGNOSIS — Z9989 Dependence on other enabling machines and devices: Principal | ICD-10-CM

## 2017-09-07 DIAGNOSIS — G4733 Obstructive sleep apnea (adult) (pediatric): Secondary | ICD-10-CM | POA: Insufficient documentation

## 2017-09-16 DIAGNOSIS — G4733 Obstructive sleep apnea (adult) (pediatric): Secondary | ICD-10-CM | POA: Diagnosis not present

## 2017-09-16 DIAGNOSIS — G479 Sleep disorder, unspecified: Secondary | ICD-10-CM | POA: Diagnosis not present

## 2017-10-16 DIAGNOSIS — G4733 Obstructive sleep apnea (adult) (pediatric): Secondary | ICD-10-CM | POA: Diagnosis not present

## 2017-10-16 DIAGNOSIS — G479 Sleep disorder, unspecified: Secondary | ICD-10-CM | POA: Diagnosis not present

## 2017-11-16 DIAGNOSIS — G4733 Obstructive sleep apnea (adult) (pediatric): Secondary | ICD-10-CM | POA: Diagnosis not present

## 2017-11-16 DIAGNOSIS — G479 Sleep disorder, unspecified: Secondary | ICD-10-CM | POA: Diagnosis not present

## 2017-12-16 DIAGNOSIS — G4733 Obstructive sleep apnea (adult) (pediatric): Secondary | ICD-10-CM | POA: Diagnosis not present

## 2017-12-16 DIAGNOSIS — G479 Sleep disorder, unspecified: Secondary | ICD-10-CM | POA: Diagnosis not present

## 2018-01-16 DIAGNOSIS — G479 Sleep disorder, unspecified: Secondary | ICD-10-CM | POA: Diagnosis not present

## 2018-01-16 DIAGNOSIS — G4733 Obstructive sleep apnea (adult) (pediatric): Secondary | ICD-10-CM | POA: Diagnosis not present

## 2018-02-16 DIAGNOSIS — G479 Sleep disorder, unspecified: Secondary | ICD-10-CM | POA: Diagnosis not present

## 2018-02-16 DIAGNOSIS — G4733 Obstructive sleep apnea (adult) (pediatric): Secondary | ICD-10-CM | POA: Diagnosis not present

## 2018-03-18 DIAGNOSIS — G479 Sleep disorder, unspecified: Secondary | ICD-10-CM | POA: Diagnosis not present

## 2018-03-18 DIAGNOSIS — G4733 Obstructive sleep apnea (adult) (pediatric): Secondary | ICD-10-CM | POA: Diagnosis not present

## 2018-04-18 DIAGNOSIS — G4733 Obstructive sleep apnea (adult) (pediatric): Secondary | ICD-10-CM | POA: Diagnosis not present

## 2018-04-18 DIAGNOSIS — G479 Sleep disorder, unspecified: Secondary | ICD-10-CM | POA: Diagnosis not present

## 2018-05-18 DIAGNOSIS — G479 Sleep disorder, unspecified: Secondary | ICD-10-CM | POA: Diagnosis not present

## 2018-05-18 DIAGNOSIS — G4733 Obstructive sleep apnea (adult) (pediatric): Secondary | ICD-10-CM | POA: Diagnosis not present

## 2018-07-15 DIAGNOSIS — G479 Sleep disorder, unspecified: Secondary | ICD-10-CM | POA: Diagnosis not present

## 2018-07-15 DIAGNOSIS — G4733 Obstructive sleep apnea (adult) (pediatric): Secondary | ICD-10-CM | POA: Diagnosis not present

## 2019-01-04 ENCOUNTER — Encounter: Payer: Self-pay | Admitting: Internal Medicine

## 2019-01-04 ENCOUNTER — Other Ambulatory Visit: Payer: Self-pay

## 2019-01-04 ENCOUNTER — Ambulatory Visit (INDEPENDENT_AMBULATORY_CARE_PROVIDER_SITE_OTHER): Payer: Managed Care, Other (non HMO) | Admitting: Internal Medicine

## 2019-01-04 VITALS — BP 128/78 | HR 101 | Ht 64.0 in | Wt >= 6400 oz

## 2019-01-04 DIAGNOSIS — B373 Candidiasis of vulva and vagina: Secondary | ICD-10-CM

## 2019-01-04 DIAGNOSIS — B3731 Acute candidiasis of vulva and vagina: Secondary | ICD-10-CM

## 2019-01-04 DIAGNOSIS — I879 Disorder of vein, unspecified: Secondary | ICD-10-CM

## 2019-01-04 LAB — POCT WET PREP WITH KOH
KOH Prep POC: POSITIVE — AB
Trichomonas, UA: NEGATIVE
Yeast Wet Prep HPF POC: POSITIVE

## 2019-01-04 MED ORDER — FLUCONAZOLE 100 MG PO TABS: 100.0000 mg | ORAL_TABLET | Freq: Every day | ORAL | 0 refills | Status: AC

## 2019-01-04 NOTE — Progress Notes (Signed)
Date:  01/04/2019   Name:  Chelsea Willis   DOB:  1986-05-09   MRN:  [id number]   Chief Complaint: Breast Mass (Knot under left armpit- breast exam. ) and Vaginal Itching (Vaginal swelling and itching. Started 2 weeks. Took monistat for 1 week with no relief. Then 2nd week used [employer] Brand Yeast Tablet for 3 days. It helped but she is still itching. )  Vaginal Itching The patient's primary symptoms include genital itching and vaginal discharge. Pertinent negatives include no chills, dysuria, fever, frequency, headaches or hematuria.  Breast tenderness - pt has noticed some tenderness of both lateral upper breasts.  Review of Systems  Constitutional: Negative for chills, fatigue and fever.  Respiratory: Negative for chest tightness and shortness of breath.   Cardiovascular: Negative for chest pain and palpitations.  Genitourinary: Positive for vaginal discharge. Negative for dysuria, frequency, genital sores and hematuria.       Breast tenderness both sides  Neurological: Negative for dizziness and headaches.    Patient Active Problem List   Diagnosis Date Noted  . OSA on CPAP 09/07/2017  . Mass of axilla 12/16/2015  . S/P laparoscopic hysterectomy 12/16/2015  . Morbid obesity ([place]) 11/03/2013    Allergies  Allergen Reactions  . Hydrocodone-Acetaminophen Nausea And Vomiting    Past Surgical History:  Procedure Laterality Date  . ABDOMINAL HYSTERECTOMY  09/2015   with left salpingectomy  . CESAREAN SECTION  2015  . MYOMECTOMY Right    2012  . OOPHORECTOMY Right 2012    Social History   Tobacco Use  . Smoking status: Never Smoker  . Smokeless tobacco: Never Used  Substance Use Topics  . Alcohol use: No  . Drug use: No     Medication list has been reviewed and updated.  Current Meds  Medication Sig  . Multiple Vitamins-Minerals (MULTIVITAMIN WITH MINERALS) tablet Take by mouth.  . [DISCONTINUED] albuterol (PROVENTIL HFA;VENTOLIN HFA) 108 (90  Base) MCG/ACT inhaler Inhale 2 puffs into the lungs every 6 (six) hours as needed for wheezing or shortness of breath.   Current Facility-Administered Medications for the 01/04/19 encounter (Office Visit) with Glean Hess, MD  Medication  . albuterol (PROVENTIL) (2.5 MG/3ML) 0.083% nebulizer solution 2.5 mg    PHQ 2/9 Scores 01/04/2019 06/09/2017 12/16/2015  PHQ - 2 Score 0 0 2  PHQ- 9 Score - - 10    BP Readings from Last 3 Encounters:  01/04/19 128/78  07/26/17 138/84  06/09/17 122/68    Physical Exam Constitutional:      Appearance: She is obese.  Neck:     Musculoskeletal: Normal range of motion.  Cardiovascular:     Rate and Rhythm: Normal rate and regular rhythm.  Pulmonary:     Effort: Pulmonary effort is normal.     Breath sounds: No wheezing or rhonchi.  Chest:     Breasts:        Right: Tenderness present. No mass or nipple discharge.        Left: Tenderness present. No mass or nipple discharge.       Comments: Prominent veins both lateral breast - tender to palpation without cord, veins compress when supine Genitourinary:    Labia:        Right: No rash, tenderness or lesion.        Left: No rash, tenderness or lesion.      Vagina: Vaginal discharge present. No erythema or bleeding.     Comments: Swab obtained for wet  prep Musculoskeletal:       Arms:  Skin:    General: Skin is warm.  Neurological:     Mental Status: She is alert.  Psychiatric:        Attention and Perception: Attention normal.     Wt Readings from Last 3 Encounters:  01/04/19 (!) 417 lb (189.1 kg)  07/26/17 (!) 411 lb (186.4 kg)  06/09/17 (!) 378 lb (171.5 kg)    BP 128/78   Pulse (!) 101   Ht 5\' 4"  (1.626 m)   Wt (!) 417 lb (189.1 kg)   LMP 08/03/2015 Comment: fibroids and endometriosis   SpO2 96%   BMI 71.58 kg/m   Assessment and Plan: 1. Yeast vaginitis Will treat with Diflucan x 1 week - POCT Wet Prep with KOH - fluconazole (DIFLUCAN) 100 MG tablet; Take 1  tablet (100 mg total) by mouth daily for 7 days.  Dispense: 7 tablet; Refill: 0  2. Vein disorder Pt reassured - if worsening may need to consult Dermatology   Partially dictated using Dragon software. Any errors are unintentional.  Bari EdwardLaura Tekesha Almgren, MD [facility]  01/04/2019

## 2019-01-23 ENCOUNTER — Telehealth: Payer: Self-pay

## 2019-01-23 NOTE — Telephone Encounter (Signed)
Patient called complaining of yeast infection coming back. Said we treated her less than 2 weeks ago for yeast infection-7 days of diflucan. Her symptoms went away and were betetr but not they started back 2 days ago.  Told her to contact a Gyn specialist close to her location to be seen.If she cannot get in soon, asked her to call me back. She verbalized understanding.

## 2019-03-21 ENCOUNTER — Other Ambulatory Visit: Payer: Self-pay

## 2019-03-21 ENCOUNTER — Encounter: Payer: Self-pay | Admitting: Emergency Medicine

## 2019-03-21 ENCOUNTER — Ambulatory Visit
Admission: EM | Admit: 2019-03-21 | Discharge: 2019-03-21 | Disposition: A | Discharge status: Home / Self Care | Payer: Managed Care, Other (non HMO) | Attending: Family Medicine | Admitting: Family Medicine

## 2019-03-21 DIAGNOSIS — L0291 Cutaneous abscess, unspecified: Secondary | ICD-10-CM

## 2019-03-21 MED ORDER — TRAMADOL HCL 50 MG PO TABS: 50.0000 mg | ORAL_TABLET | Freq: Three times a day (TID) | ORAL | 0 refills | Status: DC | PRN

## 2019-03-21 MED ORDER — DOXYCYCLINE HYCLATE 100 MG PO CAPS: 100.0000 mg | ORAL_CAPSULE | Freq: Two times a day (BID) | ORAL | 0 refills | Status: DC

## 2019-03-21 NOTE — ED Triage Notes (Signed)
Patient c/o boil to inner right thigh x 3 days. She stated the boil opened up about 2 hours ago and she is having a lot of pain.

## 2019-03-21 NOTE — ED Provider Notes (Signed)
[facility]    CSN: [id number] Arrival date & time: 03/21/19  1422  History   Chief Complaint Chief Complaint  Patient presents with  . Recurrent Skin Infections    APPT   HPI   33 year old female presents with a suspected abscess.  Patient states that she has had a "boil" for the past 3 days.  Location: right inner thigh. She states that it ruptured today.  She states that she has been bleeding as a result.  She reports severe pain, 8/10 in severity.  No fever.  No medications or interventions tried.  No known inciting factor.  No known exacerbating factors.  No other complaints.  PMH, Surgical Hx, Family Hx, Social History reviewed and updated as below.  PMH: Patient Active Problem List   Diagnosis Date Noted  . OSA on CPAP 09/07/2017  . Mass of axilla 12/16/2015  . S/P laparoscopic hysterectomy 12/16/2015  . Morbid obesity ([place]) 11/03/2013  Hx of placenta previa  Past Surgical History:  Procedure Laterality Date  . ABDOMINAL HYSTERECTOMY  09/2015   with left salpingectomy  . CESAREAN SECTION  2015  . MYOMECTOMY Right    2012  . OOPHORECTOMY Right 2012    OB History   No obstetric history on file.      Home Medications    Prior to Admission medications   Medication Sig Start Date End Date Taking? Authorizing Provider  Multiple Vitamins-Minerals (MULTIVITAMIN WITH MINERALS) tablet Take by mouth.   Yes [provider]  doxycycline (VIBRAMYCIN) 100 MG capsule Take 1 capsule (100 mg total) by mouth 2 (two) times daily. 03/21/19   Coral Spikes, DO  traMADol (ULTRAM) 50 MG tablet Take 1 tablet (50 mg total) by mouth every 8 (eight) hours as needed. 03/21/19   Coral Spikes, DO    Family History Family History  Problem Relation Age of Onset  . Hypertension Father   . Hypertension Maternal Grandmother   . Drug abuse Maternal Grandmother   . Hypertension Paternal Grandmother   . Drug abuse Paternal Grandmother   . Hypertension Paternal  Grandfather     Social History Social History   Tobacco Use  . Smoking status: Never Smoker  . Smokeless tobacco: Never Used  Substance Use Topics  . Alcohol use: No  . Drug use: No     Allergies   Hydrocodone-acetaminophen   Review of Systems Review of Systems  Constitutional: Negative.   Skin:       Boil, left inner thigh.   Physical Exam Triage Vital Signs ED Triage Vitals  Enc Vitals Group     BP 03/21/19 1457 (!) 150/95     Pulse Rate 03/21/19 1457 96     Resp 03/21/19 1457 18     Temp 03/21/19 1457 98.7 F (37.1 C)     Temp Source 03/21/19 1457 Oral     SpO2 03/21/19 1457 98 %     Weight 03/21/19 1456 (!) 377 lb (171 kg)     Height 03/21/19 1456 5\' 5"  (1.651 m)     Head Circumference --      Peak Flow --      Pain Score 03/21/19 1456 8     Pain Loc --      Pain Edu? --      Excl. in [place]? --    Updated Vital Signs BP (!) 150/95 (BP Location: Right Arm)   Pulse 96   Temp 98.7 F (37.1 C) (Oral)   Resp  18   Ht 5\' 5"  (1.651 m)   Wt (!) 171 kg   LMP 08/03/2015 Comment: fibroids and endometriosis   SpO2 98%   BMI 62.74 kg/m   Visual Acuity Right Eye Distance:   Left Eye Distance:   Bilateral Distance:    Right Eye Near:   Left Eye Near:    Bilateral Near:     Physical Exam Vitals signs and nursing note reviewed. Exam conducted with a chaperone present.  Constitutional:      General: She is not in acute distress.    Appearance: Normal appearance. She is obese. She is not ill-appearing.  HENT:     Head: Normocephalic and atraumatic.  Eyes:     General:        Right eye: No discharge.        Left eye: No discharge.     Conjunctiva/sclera: Conjunctivae normal.  Cardiovascular:     Rate and Rhythm: Normal rate and regular rhythm.  Pulmonary:     Effort: Pulmonary effort is normal. No respiratory distress.  Skin:    Comments: Right medial thigh (proximal) -large area of induration with surrounding erythema.  Not particularly warm to the  touch.  Draining blood.  Neurological:     Mental Status: She is alert.  Psychiatric:        Mood and Affect: Mood normal.        Behavior: Behavior normal.    UC Treatments / Results  Labs (all labs ordered are listed, but only abnormal results are displayed) Labs Reviewed - No data to display  EKG   Radiology No results found.  Procedures Incision and Drainage  Date/Time: 03/21/2019 7:14 PM Performed by: Tommie Samsook, Kearstin Learn G, DO Authorized by: Tommie Samsook, Deanne Bedgood G, DO   Consent:    Consent obtained:  Verbal   Consent given by:  Patient   Risks discussed:  Pain Location:    Type:  Abscess   Location:  Lower extremity   Lower extremity location:  Leg   Leg location:  R upper leg Pre-procedure details:    Skin preparation:  Betadine Anesthesia (see MAR for exact dosages):    Anesthesia method:  Local infiltration   Local anesthetic:  Lidocaine 1% WITH epi Procedure type:    Complexity:  Simple Procedure details:    Incision types:  Stab incision   Scalpel blade:  11   Drainage:  Bloody   Drainage amount:  Moderate   Wound treatment:  Wound left open   Packing materials:  None Post-procedure details:    Patient tolerance of procedure:  Tolerated well, no immediate complications   (including critical care time)  Medications Ordered in UC Medications - No data to display  Initial Impression / Assessment and Plan / UC Course  I have reviewed the triage vital signs and the nursing notes.  Pertinent labs & imaging results that were available during my care of the patient were reviewed by me and considered in my medical decision making (see chart for details).    33 year old female presents with abscess.  No significant purulent drainage following incision and drainage.  Placing on doxycycline.  Tramadol for pain.  Supportive care.  Final Clinical Impressions(s) / UC Diagnoses   Final diagnoses:  Abscess   Discharge Instructions   None    ED Prescriptions     Medication Sig Dispense Auth. Provider   doxycycline (VIBRAMYCIN) 100 MG capsule Take 1 capsule (100 mg total) by mouth 2 (two) times daily. 14  capsule Everlene Other G, DO   traMADol (ULTRAM) 50 MG tablet Take 1 tablet (50 mg total) by mouth every 8 (eight) hours as needed. 15 tablet Everlene Other G, DO     I have reviewed the PDMP during this encounter.   Tommie Sams, [place] 03/21/19 1916

## 2019-05-12 ENCOUNTER — Encounter: Payer: Managed Care, Other (non HMO) | Admitting: Internal Medicine

## 2019-06-19 ENCOUNTER — Other Ambulatory Visit: Payer: Self-pay

## 2019-06-19 ENCOUNTER — Ambulatory Visit
Admission: EM | Admit: 2019-06-19 | Discharge: 2019-06-19 | Disposition: A | Discharge status: Home / Self Care | Payer: BC Managed Care – PPO | Attending: Urgent Care | Admitting: Urgent Care

## 2019-06-19 DIAGNOSIS — R150 Incomplete defecation: Secondary | ICD-10-CM | POA: Diagnosis not present

## 2019-06-19 DIAGNOSIS — K6289 Other specified diseases of anus and rectum: Secondary | ICD-10-CM | POA: Diagnosis not present

## 2019-06-19 DIAGNOSIS — M545 Low back pain: Secondary | ICD-10-CM | POA: Diagnosis not present

## 2019-06-19 DIAGNOSIS — K648 Other hemorrhoids: Secondary | ICD-10-CM

## 2019-06-19 DIAGNOSIS — K5900 Constipation, unspecified: Secondary | ICD-10-CM

## 2019-06-19 MED ORDER — HYDROCORTISONE (PERIANAL) 2.5 % EX CREA: 1.0000 "application " | TOPICAL_CREAM | Freq: Two times a day (BID) | CUTANEOUS | 0 refills | Status: DC

## 2019-06-19 MED ORDER — DOCUSATE SODIUM 100 MG PO CAPS: 100.0000 mg | ORAL_CAPSULE | Freq: Two times a day (BID) | ORAL | 0 refills | Status: DC

## 2019-06-19 NOTE — Discharge Instructions (Addendum)
It was very nice seeing you today in clinic. Thank you for entrusting me with your care.   Increase fluid intake and activity level. Continue to use miralax and add the medications that we discussed. Your prescriptions has been called in to your pharmacy.   Make arrangements to follow up with your regular doctor in 1 week for re-evaluation if not improving. If your symptoms/condition worsens, please seek follow up care either here or in the ER. Please remember, our [facility] providers are "right here with you" when you need [place].   Again, it was my pleasure to take care of you today. Thank you for choosing our clinic. I hope that you start to feel better quickly.   Quentin Mulling, MSN, APRN, FNP-C, CEN Advanced Practice Provider [facility]

## 2019-06-19 NOTE — ED Provider Notes (Signed)
[place], [place]   Name: Chelsea Willis DOB: 03-29-1986 MRN: [id number] CSN: [id number] PCP: Reubin Milan, MD  Arrival date and time:  06/19/19 0913  Chief Complaint:  Hemorrhoids   NOTE: Prior to seeing the patient today, I have reviewed the triage nursing documentation and vital signs. Clinical staff has updated patient's PMH/PSHx, current medication list, and drug allergies/intolerances to ensure comprehensive history available to assist in medical decision making.   History:   HPI: Chelsea Willis is a 34 y.o. female who presents today with complaints of rectal pain that has been bothering her for the last 6 days. Patient reports that pain started after "sitting on a hard floor" for an extended period of time. She denies a PMH for known hemorrhoids, however feels as if this is what is causing her pain today. Due to her pain, patient reports that she has not been able to have normal bowel movements citing that the pain significantly increases with straining. LNBM was on Thursday (06/15/19). She has not appreciated any blood with wiping. Patient denies associated abdominal pain. In efforts to conservatively manage her symptoms at home, the patient notes that she has used Preparation-H suppositories, Tucks pads, Gas-ex, Miralax, and has given herself an enema. Additionally, she has been doing sitz baths using Epson salt. Patient tearful in clinic today citing that none of the interventions have significantly helped to improve her symptoms.    Past Medical History:  Diagnosis Date  . Endometriosis     Past Surgical History:  Procedure Laterality Date  . ABDOMINAL HYSTERECTOMY  09/2015   with left salpingectomy  . CESAREAN SECTION  2015  . MYOMECTOMY Right    2012  . OOPHORECTOMY Right 2012    Family History  Problem Relation Age of Onset  . Hypertension Father   . Hypertension Maternal Grandmother   . Drug abuse Maternal Grandmother   . Hypertension Paternal  Grandmother   . Drug abuse Paternal Grandmother   . Hypertension Paternal Grandfather     Social History   Tobacco Use  . Smoking status: Never Smoker  . Smokeless tobacco: Never Used  Substance Use Topics  . Alcohol use: No  . Drug use: No    Patient Active Problem List   Diagnosis Date Noted  . OSA on CPAP 09/07/2017  . Mass of axilla 12/16/2015  . S/P laparoscopic hysterectomy 12/16/2015  . Morbid obesity (HCC) 11/03/2013    Home Medications:    No outpatient medications have been marked as taking for the 06/19/19 encounter [facility] Encounter).    Allergies:   Hydrocodone-acetaminophen  Review of Systems (ROS): Review of Systems  Constitutional: Negative for chills and fever.  Respiratory: Negative for cough and shortness of breath.   Cardiovascular: Negative for chest pain and palpitations.  Gastrointestinal: Positive for constipation and rectal pain. Negative for abdominal pain, anal bleeding, blood in stool, nausea and vomiting.  Musculoskeletal: Positive for back pain.  Neurological: Negative for dizziness, syncope, weakness and headaches.  Psychiatric/Behavioral: The patient is nervous/anxious.   All other systems reviewed and are negative.    Vital Signs: Today's Vitals   06/19/19 0931 06/19/19 0933 06/19/19 1002  BP:  (!) 155/100   Pulse:  85   Resp:  16   Temp:  97.9 F (36.6 C)   TempSrc:  Oral   SpO2:  97%   Weight: (!) 384 lb (174.2 kg)    Height: 5\' 5"  (1.651 m)    PainSc: 10-Worst pain ever  10-Worst  pain ever    Physical Exam: Physical Exam  Constitutional: She is oriented to person, place, and time and well-developed, well-nourished, and in no distress.  Unable to sit down. Observed pacing in room.   HENT:  Head: Normocephalic and atraumatic.  Mouth/Throat: Mucous membranes are normal.  Eyes: Pupils are equal, round, and reactive to light. EOM are normal.  Neck: No tracheal deviation present.  Cardiovascular: Normal rate, regular  rhythm, normal heart sounds and intact distal pulses. Exam reveals no gallop and no friction rub.  No murmur heard. Pulmonary/Chest: Effort normal and breath sounds normal. No respiratory distress. She has no wheezes. She has no rales.  Abdominal: Soft. Bowel sounds are normal. She exhibits no distension. There is no abdominal tenderness.  Genitourinary: Rectum:     Internal hemorrhoid present.     No external hemorrhoid.     Genitourinary Comments: (+) hard stool within the rectal vault. (+) internal hemorrhoid noted just inside the rectal at approximately the 7 o'clock position. No obvious bleeding. Exam chaperoned by Gwinda Passe, RN.   Musculoskeletal:     Cervical back: Normal range of motion and neck supple.  Neurological: She is alert and oriented to person, place, and time. Gait normal.  Skin: Skin is warm and dry. No rash noted.  Psychiatric: Memory, affect and judgment normal. Her mood appears anxious (tearful).  Nursing note and vitals reviewed.   Urgent Care Treatments / Results:   No orders of the defined types were placed in this encounter.   LABS: PLEASE NOTE: all labs that were ordered this encounter are listed, however only abnormal results are displayed. Labs Reviewed - No data to display  EKG: -None  RADIOLOGY: No results found.  PROCEDURES: Procedures  MEDICATIONS RECEIVED THIS VISIT: Medications - No data to display  PERTINENT CLINICAL COURSE NOTES/UPDATES:   Initial Impression / Assessment and Plan / Urgent Care Course:  Pertinent labs & imaging results that were available during my care of the patient were personally reviewed by me and considered in my medical decision making (see lab/imaging section of note for values and interpretations).  Chelsea Willis is a 34 y.o. female who presents to [facility] today with complaints of Hemorrhoids   Patient is well appearing overall in clinic today. She does not appear to be in any acute distress.  Presenting symptoms (see HPI) and exam as documented above. Exam consistent with internal hemorrhoids and constipation. Patient has been using multiple interventions at home. Will add BID docusate and continue Miralax. Discussed increasing fluid and fiber intake, as well as activity level, in efforts to promote normal bowel movements. Will send in prescription for Proctozone 2.5% topical to help with hemorrhoidal pain. Encouraged patient to avoid prolonged sitting and straining to have bowel movements. May use Tylenol and/or Ibuprofen as needed for pain as well.   Discussed follow up with primary care physician in 1 week for re-evaluation. I have reviewed the follow up and strict return precautions for any new or worsening symptoms. Patient is aware of symptoms that would be deemed urgent/emergent, and would thus require further evaluation either here or in the emergency department. At the time of discharge, she verbalized understanding and consent with the discharge plan as it was reviewed with her. All questions were fielded by provider and/or clinic staff prior to patient discharge.    Final Clinical Impressions / Urgent Care Diagnoses:   Final diagnoses:  Constipation, unspecified constipation type  Internal hemorrhoid    New Prescriptions:  [place] Controlled Substance Registry consulted? Not Applicable  Meds ordered this encounter  Medications  . docusate sodium (COLACE) 100 MG capsule    Sig: Take 1 capsule (100 mg total) by mouth every 12 (twelve) hours.    Dispense:  60 capsule    Refill:  0  . hydrocortisone (PROCTOZONE-HC) 2.5 % rectal cream    Sig: Place 1 application rectally 2 (two) times daily.    Dispense:  30 g    Refill:  0    Recommended Follow up Care:  Patient encouraged to follow up with the following provider within the specified time frame, or sooner as dictated by the severity of her symptoms. As always, she was instructed that for any urgent/emergent care needs, she  should seek care either here or in the emergency department for more immediate evaluation.  Follow-up Information    Reubin Milan, MD In 1 week.   Specialty: Internal Medicine Why: General reassessment of symptoms if not improving Contact information: [address] [place] [address] [phone]         NOTE: This note was prepared using Dragon dictation software along with smaller phrase technology. Despite my best ability to proofread, there is the potential that transcriptional errors may still occur from this process, and are completely unintentional.    Verlee Monte, NP 06/20/19 1942

## 2019-06-19 NOTE — ED Triage Notes (Signed)
Patient states that she has severe hemorrhoids that started 6 days ago. Patient states that they are currently making her back and buttocks ache. Patient states that she has done baths with epsom salt as well as taken suppositories with no relief.

## 2019-07-23 ENCOUNTER — Ambulatory Visit
Admission: EM | Admit: 2019-07-23 | Discharge: 2019-07-23 | Disposition: A | Discharge status: Home / Self Care | Payer: BC Managed Care – PPO | Attending: Family Medicine | Admitting: Family Medicine

## 2019-07-23 ENCOUNTER — Encounter: Payer: Self-pay | Admitting: Emergency Medicine

## 2019-07-23 ENCOUNTER — Other Ambulatory Visit: Payer: Self-pay

## 2019-07-23 DIAGNOSIS — K5900 Constipation, unspecified: Secondary | ICD-10-CM | POA: Diagnosis present

## 2019-07-23 DIAGNOSIS — K649 Unspecified hemorrhoids: Secondary | ICD-10-CM

## 2019-07-23 DIAGNOSIS — N390 Urinary tract infection, site not specified: Secondary | ICD-10-CM | POA: Diagnosis present

## 2019-07-23 LAB — URINALYSIS, COMPLETE (UACMP) WITH MICROSCOPIC
Glucose, UA: NEGATIVE mg/dL
Ketones, ur: NEGATIVE mg/dL
Nitrite: NEGATIVE
Protein, ur: NEGATIVE mg/dL
Specific Gravity, Urine: 1.02 (ref 1.005–1.030)
WBC, UA: >50 WBC/hpf (ref 0–5)
pH: 5 (ref 5.0–8.0)

## 2019-07-23 LAB — WET PREP, GENITAL
Clue Cells Wet Prep HPF POC: NONE SEEN
Sperm: NONE SEEN
Trich, Wet Prep: NONE SEEN
Yeast Wet Prep HPF POC: NONE SEEN

## 2019-07-23 MED ORDER — NITROFURANTOIN MONOHYD MACRO 100 MG PO CAPS: 100.0000 mg | ORAL_CAPSULE | Freq: Two times a day (BID) | ORAL | 0 refills | Status: AC

## 2019-07-23 MED ORDER — POLYETHYLENE GLYCOL 3350 17 GM/SCOOP PO POWD: 17.0000 g | Freq: Every day | ORAL | 0 refills | Status: DC

## 2019-07-23 MED ORDER — POLYETHYLENE GLYCOL 3350 17 GM/SCOOP PO POWD: 1.0000 | Freq: Every day | ORAL | 0 refills | Status: DC

## 2019-07-23 MED ORDER — DOCUSATE SODIUM 100 MG PO CAPS: 100.0000 mg | ORAL_CAPSULE | Freq: Two times a day (BID) | ORAL | 3 refills | Status: DC

## 2019-07-23 MED ORDER — PHENAZOPYRIDINE HCL 200 MG PO TABS: 200.0000 mg | ORAL_TABLET | Freq: Three times a day (TID) | ORAL | 0 refills | Status: DC | PRN

## 2019-07-23 NOTE — ED Provider Notes (Signed)
[place], [place]   Name: Chelsea Willis DOB: 1986-03-01 MRN: [id number] CSN: [id number] PCP: Reubin Milan, MD  Arrival date and time:  07/23/19 1027  Chief Complaint:  Constipation and Vaginal Discharge   NOTE: Prior to seeing the patient today, I have reviewed the triage nursing documentation and vital signs. Clinical staff has updated patient's PMH/PSHx, current medication list, and drug allergies/intolerances to ensure comprehensive history available to assist in medical decision making.   History:   HPI: Chelsea Willis is a 34 y.o. female who presents today with multiple medical complaints as follows:  Urinary symptoms Patient with urinary symptoms that began with acute onset 2 days ago. She complains of dysuria, frequency, and urgency. She has not appreciated any gross hematuria, nor has she noticed her urine being malodorous. Patient denies any associated nausea, vomiting, fever, or chills. She has experienced pain in the RIGHT side of her lower back. She denies flank area and abdominal pain. Patient advises that she does not have a past medical history that is significant for recurrent urinary tract infections. Patient with some mild clear vaginal discharge. Patient recently treated for vulvovaginal candidiasis, using a course of oral fluconazole, by her GYN. She advises that her discharge has improved overall since onset. She denies any vaginal pain or bleeding.. Patient's last menstrual period was 08/03/2015. There are no concerns that she is currently pregnant.   Constipation Patient with ongoing constipation. This most recent episode has been going on for the last 2 day. She was see here by me on 06/19/2019, at which time she was started on docusate BID. Patient has been using intervention as prescribed. She notes that she took a dose of MOM earlier this morning, which helped her to be able to have a small bowel movement. Additionally, patient reports that she is  making conscious efforts to increase her activity, fluid intake, and dietary fiber intake as discussed during her last visit.   Hemorrhoids Patient seen for same on 06/19/2019. DRE reveal single small internal hemorrhoid. Proctozone cream prescribed, which patient notes was effective in helping with her symptoms. Recent issues with recurrent constipation has caused an acute recurrence of her rectal pain.   Past Medical History:  Diagnosis Date  . Endometriosis     Past Surgical History:  Procedure Laterality Date  . ABDOMINAL HYSTERECTOMY  09/2015   with left salpingectomy  . CESAREAN SECTION  2015  . MYOMECTOMY Right    2012  . OOPHORECTOMY Right 2012    Family History  Problem Relation Age of Onset  . Hypertension Father   . Hypertension Maternal Grandmother   . Drug abuse Maternal Grandmother   . Hypertension Paternal Grandmother   . Drug abuse Paternal Grandmother   . Hypertension Paternal Grandfather     Social History   Tobacco Use  . Smoking status: Never Smoker  . Smokeless tobacco: Never Used  Substance Use Topics  . Alcohol use: No  . Drug use: No    Patient Active Problem List   Diagnosis Date Noted  . OSA on CPAP 09/07/2017  . Mass of axilla 12/16/2015  . S/P laparoscopic hysterectomy 12/16/2015  . Morbid obesity (HCC) 11/03/2013    Home Medications:    Current Meds  Medication Sig  . fluconazole (DIFLUCAN) 150 MG tablet Take by mouth.  . hydrocortisone (PROCTOZONE-HC) 2.5 % rectal cream Place 1 application rectally 2 (two) times daily.  . [DISCONTINUED] docusate sodium (COLACE) 100 MG capsule Take 1 capsule (100 mg total)  by mouth every 12 (twelve) hours.    Allergies:   Hydrocodone-acetaminophen  Review of Systems (ROS): Review of Systems  Constitutional: Negative for chills and fever.  Respiratory: Negative for cough and shortness of breath.   Cardiovascular: Negative for chest pain and palpitations.  Gastrointestinal: Positive for anal  bleeding (2/2 known hemorrhoids; improved overall) and constipation. Negative for abdominal pain, nausea and vomiting.  Genitourinary: Positive for dysuria, frequency, urgency and vaginal discharge. Negative for decreased urine volume, hematuria, pelvic pain, vaginal bleeding and vaginal pain.  Musculoskeletal: Positive for back pain.  Skin: Negative for color change, pallor and rash.  Neurological: Negative for dizziness, syncope, weakness and headaches.  Psychiatric/Behavioral: The patient is nervous/anxious.   All other systems reviewed and are negative.    Vital Signs: Today's Vitals   07/23/19 1042 07/23/19 1046 07/23/19 1130  BP:  (!) 135/92   Pulse:  90   Resp:  16   Temp:  98.1 F (36.7 C)   TempSrc:  Oral   SpO2:  96%   Weight: (!) 384 lb 0.7 oz (174.2 kg)    Height: 5\' 6"  (1.676 m)    PainSc: 7   7     Physical Exam: Physical Exam  Constitutional: She is oriented to person, place, and time and well-developed, well-nourished, and in no distress.  HENT:  Head: Normocephalic and atraumatic.  Eyes: Pupils are equal, round, and reactive to light.  Cardiovascular: Normal rate, regular rhythm, normal heart sounds and intact distal pulses.  Pulmonary/Chest: Effort normal and breath sounds normal.  Abdominal: Soft. Normal appearance and bowel sounds are normal. She exhibits no distension. There is abdominal tenderness in the suprapubic area. There is no CVA tenderness.  Genitourinary:    Genitourinary Comments: Repeat rectal exam deferred; patient with known internal hemorrhoid.    Neurological: She is alert and oriented to person, place, and time. Gait normal.  Skin: Skin is warm and dry. No rash noted. She is not diaphoretic.  Psychiatric: Mood, memory, affect and judgment normal.  Nursing note and vitals reviewed.   Urgent Care Treatments / Results:   Orders Placed This Encounter  Procedures  . Wet prep, genital  . Urine culture  . Urinalysis, Complete w Microscopic     LABS: PLEASE NOTE: all labs that were ordered this encounter are listed, however only abnormal results are displayed. Labs Reviewed  WET PREP, GENITAL - Abnormal; Notable for the following components:      Result Value   WBC, Wet Prep HPF POC MANY (*)    All other components within normal limits  URINALYSIS, COMPLETE (UACMP) WITH MICROSCOPIC - Abnormal; Notable for the following components:   APPearance CLOUDY (*)    Hgb urine dipstick MODERATE (*)    Bilirubin Urine SMALL (*)    Leukocytes,Ua LARGE (*)    Bacteria, UA FEW (*)    All other components within normal limits  URINE CULTURE    EKG: -None  RADIOLOGY: No results found.  PROCEDURES: Procedures  MEDICATIONS RECEIVED THIS VISIT: Medications - No data to display  PERTINENT CLINICAL COURSE NOTES/UPDATES:   Initial Impression / Assessment and Plan / Urgent Care Course:  Pertinent labs & imaging results that were available during my care of the patient were personally reviewed by me and considered in my medical decision making (see lab/imaging section of note for values and interpretations).  Chelsea Willis is a 34 y.o. female who presents to [facility] today with complaints of Constipation and Vaginal  Discharge  Patient is well appearing overall in clinic today. She does not appear to be in any acute distress. Presenting symptoms (see HPI) and exam as documented above. Patient with multiple issues today. Will address ongoing complaints and new symptoms as follows:  Urinary issues and vaginal discharge  Wet prep swab did not reveal any candida, clue cells, or trichomonas.    UA was (+) for infection; reflex culture sent. Will treat with a 5 day course of nitrofurantoin. Patient encouraged to complete the entire course of antibiotics even if she begins to feel better. She was advised that if culture demonstrates resistance to the prescribed antibiotic, she will be contacted and advised of the need to  change the antibiotic being used to treat her infection. Patient encouraged to increase her fluid intake as much as possible. Discussed that water is always best to flush the urinary tract. She was advised to avoid caffeine containing fluids until her infections clears, as caffeine can cause her to experience painful bladder spasms. May use Tylenol and/or Ibuprofen as needed for pain/fever. Will also send in prescription for phenazopyridine to help relieve her current urinary pain.   Constipation and hemorrhoids  Multifactorial issue. Discussed that hemorrhoidal pain and bleeding is directly associated with her constipation and straining to have a bowel movement.    Continue Proctozone-HCL 2.5% cream as previously prescribed; has retained supply.  Continue docusate BID; Rx sent in today to refill medication for #60r3  Add Miralax 17 gm PO daily  Encouraged to continue to increase free water intake, dietary fiber, and activity level.  Discussed follow up with primary care physician in 1 week for re-evaluation. I have reviewed the follow up and strict return precautions for any new or worsening symptoms. Patient is aware of symptoms that would be deemed urgent/emergent, and would thus require further evaluation either here or in the emergency department. At the time of discharge, she verbalized understanding and consent with the discharge plan as it was reviewed with her. All questions were fielded by provider and/or clinic staff prior to patient discharge.    Final Clinical Impressions / Urgent Care Diagnoses:   Final diagnoses:  Urinary tract infection without hematuria, site unspecified  Constipation, unspecified constipation type  Hemorrhoids, unspecified hemorrhoid type    New Prescriptions:  [place] Controlled Substance Registry consulted? Not Applicable  Meds ordered this encounter  Medications  . docusate sodium (COLACE) 100 MG capsule    Sig: Take 1 capsule (100 mg total) by mouth  every 12 (twelve) hours.    Dispense:  60 capsule    Refill:  3  . nitrofurantoin, macrocrystal-monohydrate, (MACROBID) 100 MG capsule    Sig: Take 1 capsule (100 mg total) by mouth 2 (two) times daily for 5 days.    Dispense:  10 capsule    Refill:  0  . phenazopyridine (PYRIDIUM) 200 MG tablet    Sig: Take 1 tablet (200 mg total) by mouth 3 (three) times daily as needed for pain.    Dispense:  9 tablet    Refill:  0  . polyethylene glycol powder (MIRALAX) 17 GM/SCOOP powder    Sig: Take 17 g by mouth daily.    Dispense:  255 g    Refill:  0    Recommended Follow up Care:  Patient encouraged to follow up with the following provider within the specified time frame, or sooner as dictated by the severity of her symptoms. As always, she was instructed that for any urgent/emergent care  needs, she should seek care either here or in the emergency department for more immediate evaluation.  Follow-up Information    Glean Hess, MD In 1 week.   Specialty: Internal Medicine Why: General reassessment of symptoms if not improving Contact information: [address] [place] [address] [phone]         NOTE: This note was prepared using Dragon dictation software along with smaller phrase technology. Despite my best ability to proofread, there is the potential that transcriptional errors may still occur from this process, and are completely unintentional.    Karen Kitchens, NP 07/23/19 2239

## 2019-07-23 NOTE — Discharge Instructions (Addendum)
It was very nice seeing you today in clinic. Thank you for entrusting me with your care.   As discussed, your urine is POSITIVE for infection. Will approach treatment as follows:  Prescription has been sent to your pharmacy for antibiotics.  Please pick up and take as directed. FINISH the entire course of medication even if you are feeling better.  A culture will be sent on your provided sample. If it comes back resistant to what I have prescribed you, someone will call you and let you know that we will need to change antibiotics. Increase fluid intake as much as possible to flush your urinary tract.  Water is always the best.  Avoid caffeine until your infection clears up, as it can contribute to painful bladder spasms.  May use Tylenol and/or Ibuprofen as needed for pain/fever.  CONTINUE Colace TWICE a day for constipation. Will add Miralax daily.   Make arrangements to follow up with your regular doctor in 1 week for re-evaluation. If your symptoms/condition worsens, please seek follow up care either here or in the ER. Please remember, our [facility] providers are "right here with you" when you need [place].   Again, it was my pleasure to take care of you today. Thank you for choosing our clinic. I hope that you start to feel better quickly.   Quentin Mulling, MSN, APRN, FNP-C, CEN Advanced Practice Provider [facility] [place] [facility]

## 2019-07-23 NOTE — ED Triage Notes (Signed)
Patient c/o constipation for couple of day.  Patient states that she took Milk of Magnesia this morning and had a small bowel movement.  Patient also reports vaginal discharge that has improved and was treated for yeast infection 2 months ago with Diflucan. Patient also reports difficulty urinating for the past 2 days.  Patient also reports right side lower back pain.  Patient denies fevers. Patient denies N/V.

## 2019-07-24 LAB — URINE CULTURE

## 2019-07-25 ENCOUNTER — Ambulatory Visit (INDEPENDENT_AMBULATORY_CARE_PROVIDER_SITE_OTHER): Payer: BC Managed Care – PPO | Admitting: Internal Medicine

## 2019-07-25 ENCOUNTER — Other Ambulatory Visit: Payer: Self-pay

## 2019-07-25 ENCOUNTER — Encounter: Payer: Self-pay | Admitting: Internal Medicine

## 2019-07-25 VITALS — BP 130/72 | HR 105 | Temp 97.7°F | Ht 66.0 in | Wt 351.0 lb

## 2019-07-25 DIAGNOSIS — G4733 Obstructive sleep apnea (adult) (pediatric): Secondary | ICD-10-CM | POA: Diagnosis not present

## 2019-07-25 DIAGNOSIS — N3001 Acute cystitis with hematuria: Secondary | ICD-10-CM

## 2019-07-25 DIAGNOSIS — K648 Other hemorrhoids: Secondary | ICD-10-CM | POA: Diagnosis not present

## 2019-07-25 DIAGNOSIS — Z6841 Body Mass Index (BMI) 40.0 and over, adult: Secondary | ICD-10-CM | POA: Diagnosis not present

## 2019-07-25 DIAGNOSIS — Z9989 Dependence on other enabling machines and devices: Secondary | ICD-10-CM | POA: Diagnosis not present

## 2019-07-25 DIAGNOSIS — Z0000 Encounter for general adult medical examination without abnormal findings: Secondary | ICD-10-CM

## 2019-07-25 LAB — POCT URINALYSIS DIPSTICK
Bilirubin, UA: NEGATIVE
Glucose, UA: NEGATIVE
Ketones, UA: NEGATIVE
Nitrite, UA: POSITIVE
Protein, UA: NEGATIVE
Spec Grav, UA: 1.02 (ref 1.010–1.025)
Urobilinogen, UA: 0.2 E.U./dL
pH, UA: 5 (ref 5.0–8.0)

## 2019-07-25 MED ORDER — HYDROCORTISONE (PERIANAL) 2.5 % EX CREA: 1.0000 "application " | TOPICAL_CREAM | Freq: Two times a day (BID) | CUTANEOUS | 0 refills | Status: DC

## 2019-07-25 NOTE — Progress Notes (Signed)
Date:  07/25/2019   Name:  Chelsea Willis   DOB:  [birth year]   MRN:  [id number]   Chief Complaint: Annual Exam (Has GYN for paps. Wants Breast Exam since no Breast Exam was done at her last visit with GYN.) Chelsea Willis is a 34 y.o. female who presents today for her Complete Annual Exam. She feels fairly well. She reports exercising walking regularly. She reports she is sleeping fairly well. She denies breast issues.  She sees GYN for Pap and pelvic.   Immunization History  Administered Date(s) Administered  . Influenza,inj,Quad PF,6+ Mos 02/15/2014, 09/06/2015  . Influenza-Unspecified 03/28/2019  . PFIZER SARS-COV-2 Vaccination 07/21/2019  . Tdap 03/15/2014    Urinary Tract Infection  This is a new problem. The current episode started yesterday. The problem has been gradually improving. There has been no fever. Associated symptoms include urgency. Pertinent negatives include no chills, frequency or vomiting. Treatments tried: AZO and macrobid from UC.  Constipation This is a recurrent problem. Her stool frequency is 2 to 3 times per week. The stool is described as firm. Associated symptoms include back pain. Pertinent negatives include no abdominal pain, diarrhea, fever or vomiting. She has tried stool softeners (miralax) for the symptoms.  OSA - not using regularly but bothered by air leaks.  Using about 4 hours per night.  Lab Results  Component Value Date   CREATININE 0.63 08/03/2015   BUN 9 08/03/2015   NA 138 08/03/2015   K 3.9 08/03/2015   CL 104 08/03/2015   CO2 27 08/03/2015   No results found for: CHOL, HDL, LDLCALC, LDLDIRECT, TRIG, CHOLHDL No results found for: TSH No results found for: HGBA1C   Review of Systems  Constitutional: Negative for chills, fatigue, fever and unexpected weight change (has lost about 30 lbs with diet change and exercise).  HENT: Negative for congestion, hearing loss, tinnitus, trouble swallowing and voice change.     Eyes: Negative for visual disturbance.  Respiratory: Negative for cough, chest tightness, shortness of breath and wheezing.   Cardiovascular: Negative for chest pain, palpitations and leg swelling.  Gastrointestinal: Positive for constipation. Negative for abdominal pain, anal bleeding, diarrhea and vomiting.  Endocrine: Negative for polydipsia and polyuria.  Genitourinary: Positive for urgency. Negative for dysuria, frequency, genital sores, vaginal bleeding and vaginal discharge.  Musculoskeletal: Positive for back pain. Negative for arthralgias, gait problem and joint swelling.  Skin: Negative for color change and rash.  Neurological: Negative for dizziness, tremors, light-headedness and headaches.  Hematological: Negative for adenopathy. Does not bruise/bleed easily.  Psychiatric/Behavioral: Negative for dysphoric mood and sleep disturbance. The patient is not nervous/anxious.     Patient Active Problem List   Diagnosis Date Noted  . OSA on CPAP 09/07/2017  . Mass of axilla 12/16/2015  . S/P laparoscopic hysterectomy 12/16/2015  . Morbid obesity (HCC) 11/03/2013    Allergies  Allergen Reactions  . Hydrocodone-Acetaminophen Nausea And Vomiting    Past Surgical History:  Procedure Laterality Date  . ABDOMINAL HYSTERECTOMY  09/2015   with left salpingectomy  . CESAREAN SECTION  2015  . MYOMECTOMY Right    2012  . OOPHORECTOMY Right 2012    Social History   Tobacco Use  . Smoking status: Never Smoker  . Smokeless tobacco: Never Used  Substance Use Topics  . Alcohol use: No  . Drug use: No     Medication list has been reviewed and updated.  Current Meds  Medication Sig  . docusate sodium (COLACE)  100 MG capsule Take 1 capsule (100 mg total) by mouth every 12 (twelve) hours.  . fluconazole (DIFLUCAN) 150 MG tablet Take by mouth.  . hydrocortisone (PROCTOZONE-HC) 2.5 % rectal cream Place 1 application rectally 2 (two) times daily.  . nitrofurantoin,  macrocrystal-monohydrate, (MACROBID) 100 MG capsule Take 1 capsule (100 mg total) by mouth 2 (two) times daily for 5 days.  . phenazopyridine (PYRIDIUM) 200 MG tablet Take 1 tablet (200 mg total) by mouth 3 (three) times daily as needed for pain.  . polyethylene glycol powder (MIRALAX) 17 GM/SCOOP powder Take 17 g by mouth daily.    PHQ 2/9 Scores 07/25/2019 01/04/2019 06/09/2017 12/16/2015  PHQ - 2 Score 0 0 0 2  PHQ- 9 Score - - - 10    BP Readings from Last 3 Encounters:  07/25/19 130/72  07/23/19 (!) 135/92  06/19/19 (!) 155/100    Physical Exam Vitals and nursing note reviewed.  Constitutional:      General: She is not in acute distress.    Appearance: She is well-developed. She is obese.  HENT:     Head: Normocephalic and atraumatic.     Right Ear: Tympanic membrane and ear canal normal.     Left Ear: Tympanic membrane and ear canal normal.     Nose:     Right Sinus: No maxillary sinus tenderness.     Left Sinus: No maxillary sinus tenderness.  Eyes:     General: No scleral icterus.       Right eye: No discharge.        Left eye: No discharge.     Conjunctiva/sclera: Conjunctivae normal.  Neck:     Thyroid: No thyromegaly.     Vascular: No carotid bruit.  Cardiovascular:     Rate and Rhythm: Normal rate and regular rhythm.     Pulses: Normal pulses.     Heart sounds: Normal heart sounds.  Pulmonary:     Effort: Pulmonary effort is normal. No respiratory distress.     Breath sounds: No wheezing.  Chest:     Breasts:        Right: No mass, nipple discharge, skin change or tenderness.        Left: No mass, nipple discharge, skin change or tenderness.  Abdominal:     General: Bowel sounds are normal.     Palpations: Abdomen is soft.     Tenderness: There is no abdominal tenderness.  Musculoskeletal:        General: Normal range of motion.     Cervical back: Normal range of motion. No erythema.     Right lower leg: No edema.     Left lower leg: No edema.    Lymphadenopathy:     Cervical: No cervical adenopathy.  Skin:    General: Skin is warm and dry.     Capillary Refill: Capillary refill takes less than 2 seconds.     Findings: No rash.  Neurological:     General: No focal deficit present.     Mental Status: She is alert and oriented to person, place, and time.     Cranial Nerves: No cranial nerve deficit.     Sensory: No sensory deficit.     Deep Tendon Reflexes: Reflexes are normal and symmetric.  Psychiatric:        Speech: Speech normal.        Behavior: Behavior normal.        Thought Content: Thought content normal.  Wt Readings from Last 3 Encounters:  07/25/19 (!) 351 lb (159.2 kg)  07/23/19 (!) 384 lb 0.7 oz (174.2 kg)  06/19/19 (!) 384 lb (174.2 kg)    BP 130/72   Pulse (!) 105   Temp 97.7 F (36.5 C) (Oral)   Ht 5\' 6"  (1.676 m)   Wt (!) 351 lb (159.2 kg)   LMP 08/03/2015 Comment: fibroids and endometriosis   SpO2 94%   BMI 56.65 kg/m   Assessment and Plan: 1. Annual physical exam Normal exam except for weight She is having good results with diet change and exercise - continue - CBC with Differential/Platelet - Comprehensive metabolic panel - Lipid panel - TSH  2. OSA on CPAP Recommend using at least 4 hours per night to obtain benefits  3. Acute cystitis with hematuria Resolving on antibiotics - POCT urinalysis dipstick  4. BMI 50.0-59.9, adult ([place]) As above  5. Internal hemorrhoids without complication Recommend adjusting dose of miralax to achieve 1-2 soft stools per day Continue topical medications for hemorrhoidal irritation - hydrocortisone (PROCTOZONE-HC) 2.5 % rectal cream; Place 1 application rectally 2 (two) times daily.  Dispense: 30 g; Refill: 0   Partially dictated using Editor, commissioning. Any errors are unintentional.  Halina Maidens, MD [facility]  07/25/2019

## 2019-07-26 LAB — COMPREHENSIVE METABOLIC PANEL
ALT: 19 IU/L (ref 0–32)
AST: 16 IU/L (ref 0–40)
Albumin/Globulin Ratio: 1.5 (ref 1.2–2.2)
Albumin: 4.7 g/dL (ref 3.8–4.8)
Alkaline Phosphatase: 65 IU/L (ref 39–117)
BUN/Creatinine Ratio: 16 (ref 9–23)
BUN: 14 mg/dL (ref 6–20)
Bilirubin Total: 0.4 mg/dL (ref 0.0–1.2)
CO2: 26 mmol/L (ref 20–29)
Calcium: 10.3 mg/dL — ABNORMAL HIGH (ref 8.7–10.2)
Chloride: 94 mmol/L — ABNORMAL LOW (ref 96–106)
Creatinine, Ser: 0.87 mg/dL (ref 0.57–1.00)
GFR calc Af Amer: 103 mL/min/{1.73_m2} (ref >59)
GFR calc non Af Amer: 89 mL/min/{1.73_m2} (ref >59)
Globulin, Total: 3.2 g/dL (ref 1.5–4.5)
Glucose: 142 mg/dL — ABNORMAL HIGH (ref 65–99)
Potassium: 4 mmol/L (ref 3.5–5.2)
Sodium: 140 mmol/L (ref 134–144)
Total Protein: 7.9 g/dL (ref 6.0–8.5)

## 2019-07-26 LAB — LIPID PANEL
Chol/HDL Ratio: 3.4 ratio (ref 0.0–4.4)
Cholesterol, Total: 170 mg/dL (ref 100–199)
HDL: 50 mg/dL (ref >39)
LDL Chol Calc (NIH): 98 mg/dL (ref 0–99)
Triglycerides: 125 mg/dL (ref 0–149)
VLDL Cholesterol Cal: 22 mg/dL (ref 5–40)

## 2019-07-26 LAB — CBC WITH DIFFERENTIAL/PLATELET
Basophils Absolute: 0.1 10*3/uL (ref 0.0–0.2)
Basos: 1 %
EOS (ABSOLUTE): 0.2 10*3/uL (ref 0.0–0.4)
Eos: 3 %
Hematocrit: 47.8 % — ABNORMAL HIGH (ref 34.0–46.6)
Hemoglobin: 15.5 g/dL (ref 11.1–15.9)
Immature Grans (Abs): 0 10*3/uL (ref 0.0–0.1)
Immature Granulocytes: 0 %
Lymphocytes Absolute: 2.3 10*3/uL (ref 0.7–3.1)
Lymphs: 40 %
MCH: 27.3 pg (ref 26.6–33.0)
MCHC: 32.4 g/dL (ref 31.5–35.7)
MCV: 84 fL (ref 79–97)
Monocytes Absolute: 0.7 10*3/uL (ref 0.1–0.9)
Monocytes: 11 %
Neutrophils Absolute: 2.6 10*3/uL (ref 1.4–7.0)
Neutrophils: 45 %
Platelets: 333 10*3/uL (ref 150–450)
RBC: 5.67 x10E6/uL — ABNORMAL HIGH (ref 3.77–5.28)
RDW: 14 % (ref 11.7–15.4)
WBC: 5.8 10*3/uL (ref 3.4–10.8)

## 2019-07-26 LAB — TSH: TSH: 0.842 u[IU]/mL (ref 0.450–4.500)

## 2019-08-01 ENCOUNTER — Other Ambulatory Visit: Payer: Self-pay

## 2019-08-01 ENCOUNTER — Encounter: Payer: Self-pay | Admitting: Internal Medicine

## 2019-08-01 ENCOUNTER — Ambulatory Visit
Admission: EM | Admit: 2019-08-01 | Discharge: 2019-08-01 | Disposition: A | Discharge status: Home / Self Care | Payer: BC Managed Care – PPO | Attending: Internal Medicine | Admitting: Internal Medicine

## 2019-08-01 DIAGNOSIS — N390 Urinary tract infection, site not specified: Secondary | ICD-10-CM | POA: Insufficient documentation

## 2019-08-01 DIAGNOSIS — K5900 Constipation, unspecified: Secondary | ICD-10-CM | POA: Insufficient documentation

## 2019-08-01 DIAGNOSIS — N939 Abnormal uterine and vaginal bleeding, unspecified: Secondary | ICD-10-CM | POA: Diagnosis present

## 2019-08-01 LAB — URINALYSIS, COMPLETE (UACMP) WITH MICROSCOPIC
Bilirubin Urine: NEGATIVE
Glucose, UA: 100 mg/dL — AB
Ketones, ur: NEGATIVE mg/dL
Nitrite: POSITIVE — AB
Protein, ur: NEGATIVE mg/dL
Specific Gravity, Urine: 1.02 (ref 1.005–1.030)
pH: 7 (ref 5.0–8.0)

## 2019-08-01 MED ORDER — SENNOSIDES-DOCUSATE SODIUM 8.6-50 MG PO TABS: 1.0000 | ORAL_TABLET | Freq: Two times a day (BID) | ORAL | 0 refills | Status: AC

## 2019-08-01 MED ORDER — CEPHALEXIN 500 MG PO CAPS: 500.0000 mg | ORAL_CAPSULE | Freq: Two times a day (BID) | ORAL | 0 refills | Status: AC

## 2019-08-01 MED ORDER — HYDROCORTISONE ACETATE 25 MG RE SUPP: 25.0000 mg | Freq: Two times a day (BID) | RECTAL | 0 refills | Status: DC

## 2019-08-01 NOTE — ED Provider Notes (Signed)
[facility]    CSN: [id number] Arrival date & time: 08/01/19  1827      History   Chief Complaint Chief Complaint  Patient presents with  . Dysuria    HPI Chelsea Willis is a 34 y.o. female recently evaluated and treated for urinary tract infection, constipation and hemorrhoids comes to urgent care with complaints of suprapubic pressure, vaginal bleeding, dysuria and frequency.  Patient denies any fever or chills.  No flank pain.  She was taking antibiotics, laxatives as prescribed.  She continues to have constipation with rectal pain.  She has had some swelling of her underwear with blood and is not sure if this is related to the hemorrhoids or vaginal bleed.  She notices some blood when she voids and wipes.  Patient has been battling recurrent vaginal yeast infection for several months and is currently on weekly fluconazole regimen.  She has had hysterectomy and her last GYN evaluation was in January 2021.  She had a pelvic exam done at that time.  She has deep dyspareunia but has not been sexually active recently because of the current medical problems.   HPI  Past Medical History:  Diagnosis Date  . Endometriosis     Patient Active Problem List   Diagnosis Date Noted  . OSA on CPAP 09/07/2017  . Mass of axilla 12/16/2015  . S/P laparoscopic hysterectomy 12/16/2015  . Morbid obesity ([place]) 11/03/2013    Past Surgical History:  Procedure Laterality Date  . ABDOMINAL HYSTERECTOMY  09/2015   with left salpingectomy  . CESAREAN SECTION  2015  . MYOMECTOMY Right    2012  . OOPHORECTOMY Right 2012    OB History   No obstetric history on file.      Home Medications    Prior to Admission medications   Medication Sig Start Date End Date Taking? Authorizing Provider  polyethylene glycol powder (MIRALAX) 17 GM/SCOOP powder Take 17 g by mouth daily. 07/23/19  Yes Karen Kitchens, NP  cephALEXin (KEFLEX) 500 MG capsule Take 1 capsule (500 mg total) by mouth  2 (two) times daily for 5 days. 08/01/19 08/06/19  Chase Picket, MD  hydrocortisone (ANUSOL-HC) 25 MG suppository Place 1 suppository (25 mg total) rectally 2 (two) times daily. 08/01/19   Zoran Yankee, Myrene Galas, MD  senna-docusate (SENOKOT-S) 8.6-50 MG tablet Take 1 tablet by mouth 2 (two) times daily. 08/01/19 08/31/19  Chase Picket, MD    Family History Family History  Problem Relation Age of Onset  . Hypertension Father   . Hypertension Maternal Grandmother   . Drug abuse Maternal Grandmother   . Hypertension Paternal Grandmother   . Drug abuse Paternal Grandmother   . Hypertension Paternal Grandfather     Social History Social History   Tobacco Use  . Smoking status: Never Smoker  . Smokeless tobacco: Never Used  Substance Use Topics  . Alcohol use: No  . Drug use: No     Allergies   Hydrocodone-acetaminophen   Review of Systems Review of Systems  Constitutional: Negative for activity change, chills, fatigue and fever.  HENT: Negative.   Respiratory: Negative for shortness of breath and wheezing.   Gastrointestinal: Positive for abdominal pain, constipation and rectal pain. Negative for diarrhea, nausea and vomiting.  Genitourinary: Positive for difficulty urinating, dyspareunia, dysuria, frequency, urgency and vaginal bleeding. Negative for vaginal pain.  Musculoskeletal: Negative for arthralgias, gait problem and joint swelling.  Skin: Negative.  Negative for color change, rash and wound.  Neurological: Negative for dizziness, numbness and headaches.     Physical Exam Triage Vital Signs ED Triage Vitals  Enc Vitals Group     BP 08/01/19 1841 133/81     Pulse Rate 08/01/19 1841 80     Resp 08/01/19 1841 20     Temp 08/01/19 1841 98.3 F (36.8 C)     Temp Source 08/01/19 1841 Oral     SpO2 08/01/19 1841 99 %     Weight 08/01/19 1840 (!) 351 lb (159.2 kg)     Height 08/01/19 1840 5\' 6"  (1.676 m)     Head Circumference --      Peak Flow --      Pain  Score 08/01/19 1839 8     Pain Loc --      Pain Edu? --      Excl. in GC? --    No data found.  Updated Vital Signs BP 133/81 (BP Location: Left Wrist)   Pulse 80   Temp 98.3 F (36.8 C) (Oral)   Resp 20   Ht 5\' 6"  (1.676 m)   Wt (!) 159.2 kg   LMP 08/03/2015 Comment: fibroids and endometriosis   SpO2 99%   BMI 56.65 kg/m   Visual Acuity Right Eye Distance:   Left Eye Distance:   Bilateral Distance:    Right Eye Near:   Left Eye Near:    Bilateral Near:     Physical Exam Vitals and nursing note reviewed.  Constitutional:      General: She is not in acute distress.    Appearance: She is obese. She is not ill-appearing.  Cardiovascular:     Rate and Rhythm: Normal rate and regular rhythm.     Pulses: Normal pulses.     Heart sounds: Normal heart sounds.  Pulmonary:     Effort: Pulmonary effort is normal.     Breath sounds: Normal breath sounds.  Abdominal:     Comments: Obese anterior abdominal wall.  Musculoskeletal:        General: Normal range of motion.  Skin:    General: Skin is warm.     Capillary Refill: Capillary refill takes less than 2 seconds.  Neurological:     General: No focal deficit present.     Mental Status: She is alert and oriented to person, place, and time.      UC Treatments / Results  Labs (all labs ordered are listed, but only abnormal results are displayed) Labs Reviewed  URINALYSIS, COMPLETE (UACMP) WITH MICROSCOPIC - Abnormal; Notable for the following components:      Result Value   Color, Urine ORANGE (*)    Glucose, UA 100 (*)    Hgb urine dipstick LARGE (*)    Nitrite POSITIVE (*)    Leukocytes,Ua TRACE (*)    Bacteria, UA MANY (*)    All other components within normal limits  URINE CULTURE    EKG   Radiology No results found.  Procedures Procedures (including critical care time)  Medications Ordered in UC Medications - No data to display  Initial Impression / Assessment and Plan / UC Course  I have  reviewed the triage vital signs and the nursing notes.  Pertinent labs & imaging results that were available during my care of the patient were reviewed by me and considered in my medical decision making (see chart for details).     1.  Acute cystitis with hematuria: Urinalysis is remarkable for hemoglobin, trace leukocyte esterase, nitrite  and WBC of 6-10. Urine cultures have been sent Keflex 500 mg twice daily for 5 days  2.  Constipation Discontinue Colace Start Senokot 1 tablet twice daily MiraLAX daily Hold for diarrhea  3.  Hemorrhoids with possible hemorrhoidal bleed: Anusol suppository Avoid constipation If symptoms persist she might need general surgery or proctologist evaluation. Return precautions given. Final Clinical Impressions(s) / UC Diagnoses   Final diagnoses:  Lower urinary tract infectious disease  Vaginal bleeding  Constipation, unspecified constipation type   Discharge Instructions   None    ED Prescriptions    Medication Sig Dispense Auth. Provider   senna-docusate (SENOKOT-S) 8.6-50 MG tablet Take 1 tablet by mouth 2 (two) times daily. 60 tablet Jacson Rapaport, Britta Mccreedy, MD   hydrocortisone (ANUSOL-HC) 25 MG suppository Place 1 suppository (25 mg total) rectally 2 (two) times daily. 12 suppository Juda Toepfer, Britta Mccreedy, MD   cephALEXin (KEFLEX) 500 MG capsule Take 1 capsule (500 mg total) by mouth 2 (two) times daily for 5 days. 10 capsule Kashina Mecum, Britta Mccreedy, MD     PDMP not reviewed this encounter.   Merrilee Jansky, MD 08/01/19 [phone]

## 2019-08-01 NOTE — ED Triage Notes (Signed)
Pt presents with c/o continued urinary pain and pressure, lower right back pain and some bleeding. She has had a hysterectomy and is unsure if the bleeding is in the urine or is vaginal. She denies fever/chills or n/v. She did finish all meds given at last UC visit.

## 2019-08-02 LAB — URINE CULTURE: Special Requests: NORMAL

## 2019-08-03 ENCOUNTER — Other Ambulatory Visit: Payer: Self-pay

## 2019-08-15 DIAGNOSIS — N939 Abnormal uterine and vaginal bleeding, unspecified: Secondary | ICD-10-CM | POA: Insufficient documentation

## 2019-08-17 ENCOUNTER — Encounter: Payer: Self-pay | Admitting: Internal Medicine

## 2019-08-17 LAB — SPECIMEN STATUS REPORT

## 2019-08-17 LAB — HGB A1C W/O EAG: Hgb A1c MFr Bld: 11.9 % — ABNORMAL HIGH (ref 4.8–5.6)

## 2019-08-28 ENCOUNTER — Ambulatory Visit: Payer: BC Managed Care – PPO | Admitting: Internal Medicine

## 2019-09-06 ENCOUNTER — Encounter: Payer: Self-pay | Admitting: Internal Medicine

## 2019-09-06 ENCOUNTER — Telehealth: Payer: Self-pay | Admitting: Internal Medicine

## 2019-09-06 DIAGNOSIS — E118 Type 2 diabetes mellitus with unspecified complications: Secondary | ICD-10-CM | POA: Insufficient documentation

## 2019-09-06 DIAGNOSIS — E1165 Type 2 diabetes mellitus with hyperglycemia: Secondary | ICD-10-CM | POA: Insufficient documentation

## 2019-09-06 DIAGNOSIS — IMO0002 Reserved for concepts with insufficient information to code with codable children: Secondary | ICD-10-CM | POA: Insufficient documentation

## 2019-09-06 NOTE — Telephone Encounter (Signed)
It has been 6 weeks since her labs showed uncontrolled new onset diabetes.  She has cancelled 2 appointments to follow up and begin treatment.  She needs to be seen ASAP.

## 2019-09-07 NOTE — Telephone Encounter (Signed)
Called and left VM for patient to call back and schedule a diabetes appt ASAP.  CM

## 2019-09-12 NOTE — Telephone Encounter (Signed)
Scheduled pt for next Tuesday at 8AM.  CM

## 2019-09-19 ENCOUNTER — Other Ambulatory Visit: Payer: Self-pay

## 2019-09-19 ENCOUNTER — Encounter: Payer: Self-pay | Admitting: Internal Medicine

## 2019-09-19 ENCOUNTER — Other Ambulatory Visit: Payer: Self-pay | Admitting: Internal Medicine

## 2019-09-19 ENCOUNTER — Ambulatory Visit (INDEPENDENT_AMBULATORY_CARE_PROVIDER_SITE_OTHER): Payer: BC Managed Care – PPO | Admitting: Internal Medicine

## 2019-09-19 VITALS — BP 130/76 | HR 76 | Temp 97.9°F | Ht 66.0 in | Wt 374.0 lb

## 2019-09-19 DIAGNOSIS — E118 Type 2 diabetes mellitus with unspecified complications: Secondary | ICD-10-CM | POA: Diagnosis not present

## 2019-09-19 DIAGNOSIS — E1165 Type 2 diabetes mellitus with hyperglycemia: Secondary | ICD-10-CM | POA: Diagnosis not present

## 2019-09-19 DIAGNOSIS — IMO0002 Reserved for concepts with insufficient information to code with codable children: Secondary | ICD-10-CM

## 2019-09-19 MED ORDER — JANUMET XR 100-1000 MG PO TB24: 1.0000 | ORAL_TABLET | Freq: Every day | ORAL | 2 refills | Status: DC

## 2019-09-19 MED ORDER — BLOOD GLUCOSE MONITOR KIT: PACK | 0 refills | Status: DC

## 2019-09-19 NOTE — Patient Instructions (Addendum)
Schedule a Diabetes Eye Exam.   Test blood sugar in the morning fasting 2 days a week and record.  Test blood sugar in the evening 2 hours after your meal 2 days a week and record.

## 2019-09-19 NOTE — Progress Notes (Signed)
Date:  09/19/2019   Name:  Chelsea Willis   DOB:  08-02-1985   MRN:  [id number]   Chief Complaint: Diabetes (micro and foot exam )  Diabetes She presents for her initial diabetic visit. She has type 2 diabetes mellitus. Pertinent negatives for hypoglycemia include no dizziness or headaches. Pertinent negatives for diabetes include no chest pain and no fatigue.  She has started cutting back on sweet drinks and trying to improve her diet.  She is familiar with FSBS from her grandmother.  She does not want dietician referral at this time.  Lab Results  Component Value Date   CREATININE 0.87 07/25/2019   BUN 14 07/25/2019   NA 140 07/25/2019   K 4.0 07/25/2019   CL 94 (L) 07/25/2019   CO2 26 07/25/2019   Lab Results  Component Value Date   CHOL 170 07/25/2019   HDL 50 07/25/2019   LDLCALC 98 07/25/2019   TRIG 125 07/25/2019   CHOLHDL 3.4 07/25/2019   Lab Results  Component Value Date   TSH 0.842 07/25/2019   Lab Results  Component Value Date   HGBA1C 11.9 (H) 07/25/2019   Lab Results  Component Value Date   WBC 5.8 07/25/2019   HGB 15.5 07/25/2019   HCT 47.8 (H) 07/25/2019   MCV 84 07/25/2019   PLT 333 07/25/2019   Lab Results  Component Value Date   ALT 19 07/25/2019   AST 16 07/25/2019   ALKPHOS 65 07/25/2019   BILITOT 0.4 07/25/2019     Review of Systems  Constitutional: Negative for chills, fatigue and fever.  Eyes: Negative for visual disturbance.  Respiratory: Negative for chest tightness and shortness of breath.   Cardiovascular: Negative for chest pain.  Genitourinary:       Frequent yeast infections  Skin: Negative for wound.  Neurological: Negative for dizziness, numbness and headaches.    Patient Active Problem List   Diagnosis Date Noted  . Uncontrolled diabetes mellitus with complications ([place]) 62/95/2841  . Vaginal bleeding, abnormal 08/15/2019  . OSA on CPAP 09/07/2017  . Mass of axilla 12/16/2015  . S/P laparoscopic  hysterectomy 12/16/2015  . Morbid obesity ([place]) 11/03/2013    Allergies  Allergen Reactions  . Hydrocodone-Acetaminophen Nausea And Vomiting    Past Surgical History:  Procedure Laterality Date  . ABDOMINAL HYSTERECTOMY  09/2015   with left salpingectomy  . CESAREAN SECTION  2015  . MYOMECTOMY Right    2012  . OOPHORECTOMY Right 2012    Social History   Tobacco Use  . Smoking status: Never Smoker  . Smokeless tobacco: Never Used  Substance Use Topics  . Alcohol use: No  . Drug use: No     Medication list has been reviewed and updated.  Current Meds  Medication Sig  . fluconazole (DIFLUCAN) 150 MG tablet Take 150 mg by mouth once a week. Once a week    PHQ 2/9 Scores 09/19/2019 07/25/2019 01/04/2019 06/09/2017  PHQ - 2 Score 0 0 0 0  PHQ- 9 Score 0 - - -    BP Readings from Last 3 Encounters:  09/19/19 130/76  08/01/19 133/81  07/25/19 130/72    Physical Exam Vitals and nursing note reviewed.  Constitutional:      General: She is not in acute distress.    Appearance: She is well-developed.  HENT:     Head: Normocephalic and atraumatic.  Pulmonary:     Effort: Pulmonary effort is normal. No respiratory distress.  Musculoskeletal:  General: Normal range of motion.  Skin:    General: Skin is warm and dry.     Findings: No rash.  Neurological:     Mental Status: She is alert and oriented to person, place, and time.  Psychiatric:        Behavior: Behavior normal.        Thought Content: Thought content normal.     Wt Readings from Last 3 Encounters:  09/19/19 (!) 374 lb (169.6 kg)  08/01/19 (!) 351 lb (159.2 kg)  07/25/19 (!) 351 lb (159.2 kg)    BP 130/76   Pulse 76   Temp 97.9 F (36.6 C) (Oral)   Ht '5\' 6"'$  (1.676 m)   Wt (!) 374 lb (169.6 kg)   LMP 08/03/2015 Comment: fibroids and endometriosis   SpO2 96%   BMI 60.37 kg/m   Assessment and Plan: 1. Uncontrolled diabetes mellitus with complications ([place]) Begin janumet sample with  lunch daily - fill Rx if tolerated Check BS daily - alternate between fasting and 2 h PC Limit sweets and carbs and work on weight loss Annual Eye exam discussed and to be scheduled Check urine for protein - may need ACE or ARB No statin for now given age and normal parameters - Microalbumin / creatinine urine ratio - blood glucose meter kit and supplies KIT; Dispense based on patient and insurance preference. Use up to four times daily as directed. (FOR ICD-9 250.00, 250.01).  Dispense: 1 each; Refill: 0 - SitaGLIPtin-MetFORMIN HCl (JANUMET XR) [phone] MG TB24; Take 1 tablet by mouth daily.  Dispense: 30 tablet; Refill: 2   Partially dictated using Editor, commissioning. Any errors are unintentional.  Halina Maidens, MD [facility]  09/19/2019

## 2019-09-20 LAB — MICROALBUMIN / CREATININE URINE RATIO
Creatinine, Urine: 84.6 mg/dL
Microalb/Creat Ratio: <4 mg/g creat (ref 0–29)
Microalbumin, Urine: <3 ug/mL

## 2019-12-12 ENCOUNTER — Encounter: Payer: Self-pay | Admitting: Internal Medicine

## 2019-12-21 ENCOUNTER — Ambulatory Visit: Payer: BC Managed Care – PPO | Admitting: Internal Medicine

## 2020-01-01 ENCOUNTER — Ambulatory Visit: Payer: BC Managed Care – PPO | Admitting: Internal Medicine

## 2020-01-01 NOTE — Progress Notes (Deleted)
    Date:  01/01/2020   Name:  Chelsea Willis   DOB:  [birth year]   MRN:  [id number]   Chief Complaint: No chief complaint on file.  Diabetes She presents for her follow-up diabetic visit. She has type 2 diabetes mellitus. Her disease course has been improving. Current diabetic treatment includes oral agent (dual therapy) (janumet started last visit).  Urine microalb was negative. Eye exam is due.  Lab Results  Component Value Date   CREATININE 0.87 07/25/2019   BUN 14 07/25/2019   NA 140 07/25/2019   K 4.0 07/25/2019   CL 94 (L) 07/25/2019   CO2 26 07/25/2019   Lab Results  Component Value Date   CHOL 170 07/25/2019   HDL 50 07/25/2019   LDLCALC 98 07/25/2019   TRIG 125 07/25/2019   CHOLHDL 3.4 07/25/2019   Lab Results  Component Value Date   TSH 0.842 07/25/2019   Lab Results  Component Value Date   HGBA1C 11.9 (H) 07/25/2019   Lab Results  Component Value Date   WBC 5.8 07/25/2019   HGB 15.5 07/25/2019   HCT 47.8 (H) 07/25/2019   MCV 84 07/25/2019   PLT 333 07/25/2019   Lab Results  Component Value Date   ALT 19 07/25/2019   AST 16 07/25/2019   ALKPHOS 65 07/25/2019   BILITOT 0.4 07/25/2019     Review of Systems  Patient Active Problem List   Diagnosis Date Noted  . Uncontrolled diabetes mellitus with complications ([facility]) 09/06/2019  . Vaginal bleeding, abnormal 08/15/2019  . OSA on CPAP 09/07/2017  . Mass of axilla 12/16/2015  . S/P laparoscopic hysterectomy 12/16/2015  . Morbid obesity ([facility]) 11/03/2013    Allergies  Allergen Reactions  . Hydrocodone-Acetaminophen Nausea And Vomiting    Past Surgical History:  Procedure Laterality Date  . ABDOMINAL HYSTERECTOMY  09/2015   with left salpingectomy  . CESAREAN SECTION  2015  . MYOMECTOMY Right    2012  . OOPHORECTOMY Right 2012    Social History   Tobacco Use  . Smoking status: Never Smoker  . Smokeless tobacco: Never Used  Vaping Use  . Vaping Use: Never used  Substance Use  Topics  . Alcohol use: No  . Drug use: No     Medication list has been reviewed and updated.  No outpatient medications have been marked as taking for the 01/01/20 encounter (Appointment) with Reubin Milan, MD.    [facility] 2/9 Scores 09/19/2019 07/25/2019 01/04/2019 06/09/2017  PHQ - 2 Score 0 0 0 0  PHQ- 9 Score 0 - - -    GAD 7 : Generalized Anxiety Score 09/19/2019 12/16/2015  Nervous, Anxious, on Edge 0 1  Control/stop worrying 0 0  Worry too much - different things 0 0  Trouble relaxing 0 3  Restless 0 2  Easily annoyed or irritable 0 3  Afraid - awful might happen 0 1  Total GAD 7 Score 0 10  Anxiety Difficulty Not difficult at all Not difficult at all    BP Readings from Last 3 Encounters:  09/19/19 130/76  08/01/19 133/81  07/25/19 130/72    Physical Exam  Wt Readings from Last 3 Encounters:  09/19/19 (!) 374 lb (169.6 kg)  08/01/19 (!) 351 lb (159.2 kg)  07/25/19 (!) 351 lb (159.2 kg)    LMP 08/03/2015 Comment: fibroids and endometriosis   Assessment and Plan:

## 2020-03-20 ENCOUNTER — Ambulatory Visit: Payer: BC Managed Care – PPO | Admitting: Internal Medicine

## 2020-04-04 ENCOUNTER — Ambulatory Visit: Payer: BC Managed Care – PPO | Admitting: Internal Medicine

## 2020-04-15 ENCOUNTER — Ambulatory Visit: Payer: BC Managed Care – PPO | Admitting: Internal Medicine

## 2020-04-15 ENCOUNTER — Other Ambulatory Visit: Payer: Self-pay

## 2020-04-15 ENCOUNTER — Encounter: Payer: Self-pay | Admitting: Internal Medicine

## 2020-04-15 VITALS — BP 124/78 | HR 87 | Temp 97.7°F | Ht 66.0 in | Wt >= 6400 oz

## 2020-04-15 DIAGNOSIS — IMO0002 Reserved for concepts with insufficient information to code with codable children: Secondary | ICD-10-CM

## 2020-04-15 DIAGNOSIS — Z1159 Encounter for screening for other viral diseases: Secondary | ICD-10-CM

## 2020-04-15 DIAGNOSIS — E1165 Type 2 diabetes mellitus with hyperglycemia: Secondary | ICD-10-CM

## 2020-04-15 DIAGNOSIS — E118 Type 2 diabetes mellitus with unspecified complications: Secondary | ICD-10-CM | POA: Diagnosis not present

## 2020-04-15 MED ORDER — METFORMIN HCL ER 500 MG PO TB24: 500.0000 mg | ORAL_TABLET | Freq: Every day | ORAL | 5 refills | Status: DC

## 2020-04-15 MED ORDER — ONETOUCH DELICA PLUS LANCET30G MISC: 1.0000 | Freq: Two times a day (BID) | 3 refills | Status: DC

## 2020-04-15 MED ORDER — OZEMPIC (1 MG/DOSE) 2 MG/1.5ML ~~LOC~~ SOPN: 1.0000 mg | PEN_INJECTOR | SUBCUTANEOUS | 5 refills | Status: DC

## 2020-04-15 NOTE — Progress Notes (Signed)
Date:  04/15/2020   Name:  Chelsea Willis   DOB:  1986-05-30   MRN:  [id number]   Chief Complaint: Diabetes (Follow up from Diabetes in April. Patient said she has to take Janummet every other day becuase it causes her to feel nauseous and weak but still taking every other day. ) and Difficulty Focusing (Pt c/o focus issues. Says she looks at computer screen for work at [employer] and she zones out a lot and has issues with focusing on things for long periods of time. Says she has felt like this for a year and a half. )  Diabetes She presents for her follow-up diabetic visit. She has type 2 diabetes mellitus. Her disease course has been worsening. Pertinent negatives for hypoglycemia include no headaches or tremors. Pertinent negatives for diabetes include no chest pain, no fatigue, no polydipsia and no polyuria. Symptoms are improving. Current diabetic treatments: janumet given in april with 2 RF - She is compliant with treatment most of the time. Her breakfast blood glucose is taken between 6-7 am. Her breakfast blood glucose range is generally 90-110 mg/dl. Her bedtime blood glucose is taken between 9-10 pm. Her bedtime blood glucose range is generally 110-130 mg/dl. An ACE inhibitor/angiotensin II receptor blocker is not being taken. Eye exam is not current.    Lab Results  Component Value Date   CREATININE 0.87 07/25/2019   BUN 14 07/25/2019   NA 140 07/25/2019   K 4.0 07/25/2019   CL 94 (L) 07/25/2019   CO2 26 07/25/2019   Lab Results  Component Value Date   CHOL 170 07/25/2019   HDL 50 07/25/2019   LDLCALC 98 07/25/2019   TRIG 125 07/25/2019   CHOLHDL 3.4 07/25/2019   Lab Results  Component Value Date   TSH 0.842 07/25/2019   Lab Results  Component Value Date   HGBA1C 11.9 (H) 07/25/2019   Lab Results  Component Value Date   WBC 5.8 07/25/2019   HGB 15.5 07/25/2019   HCT 47.8 (H) 07/25/2019   MCV 84 07/25/2019   PLT 333 07/25/2019   Lab Results  Component Value  Date   ALT 19 07/25/2019   AST 16 07/25/2019   ALKPHOS 65 07/25/2019   BILITOT 0.4 07/25/2019     Review of Systems  Constitutional: Negative for appetite change, fatigue, fever and unexpected weight change.  Eyes: Negative for visual disturbance.  Respiratory: Negative for cough, chest tightness and shortness of breath.   Cardiovascular: Negative for chest pain, palpitations and leg swelling.  Gastrointestinal: Positive for nausea. Negative for abdominal pain.  Endocrine: Negative for polydipsia and polyuria.  Genitourinary: Negative for dysuria and hematuria.  Musculoskeletal: Negative for arthralgias.  Neurological: Negative for tremors, numbness and headaches.  Psychiatric/Behavioral: Positive for decreased concentration. Negative for dysphoric mood.    Patient Active Problem List   Diagnosis Date Noted  . Uncontrolled diabetes mellitus with complications (HCC) 09/06/2019  . OSA on CPAP 09/07/2017  . Mass of axilla 12/16/2015  . S/P laparoscopic hysterectomy 12/16/2015  . Morbid obesity (HCC) 11/03/2013    Allergies  Allergen Reactions  . Hydrocodone-Acetaminophen Nausea And Vomiting    Past Surgical History:  Procedure Laterality Date  . ABDOMINAL HYSTERECTOMY  09/2015   with left salpingectomy  . CESAREAN SECTION  2015  . MYOMECTOMY Right    2012  . OOPHORECTOMY Right 2012    Social History   Tobacco Use  . Smoking status: Never Smoker  . Smokeless tobacco: Never  Used  Vaping Use  . Vaping Use: Never used  Substance Use Topics  . Alcohol use: No  . Drug use: No     Medication list has been reviewed and updated.  Current Meds  Medication Sig  . Lancets (ONETOUCH DELICA PLUS LANCET30G) MISC USE UP TO FOUR TIMES DAILY AS DIRECTED  . ONETOUCH VERIO test strip SMARTSIG:Via Meter 1 to 4 Times Daily  . SitaGLIPtin-MetFORMIN HCl (JANUMET XR) [phone] MG TB24 Take 1 tablet by mouth daily. (Patient taking differently: Take 1 tablet by mouth every other day.  )    PHQ 2/9 Scores 04/15/2020 09/19/2019 07/25/2019 01/04/2019  PHQ - 2 Score 0 0 0 0  PHQ- 9 Score 3 0 - -    GAD 7 : Generalized Anxiety Score 04/15/2020 09/19/2019 12/16/2015  Nervous, Anxious, on Edge 0 0 1  Control/stop worrying 0 0 0  Worry too much - different things 0 0 0  Trouble relaxing 0 0 3  Restless 0 0 2  Easily annoyed or irritable 0 0 3  Afraid - awful might happen 0 0 1  Total GAD 7 Score 0 0 10  Anxiety Difficulty Not difficult at all Not difficult at all Not difficult at all    BP Readings from Last 3 Encounters:  04/15/20 124/78  09/19/19 130/76  08/01/19 133/81    Physical Exam Vitals and nursing note reviewed.  Constitutional:      General: She is not in acute distress.    Appearance: She is well-developed.  HENT:     Head: Normocephalic and atraumatic.  Pulmonary:     Effort: Pulmonary effort is normal. No respiratory distress.  Musculoskeletal:        General: Normal range of motion.  Skin:    General: Skin is warm and dry.     Findings: No rash.  Neurological:     Mental Status: She is alert and oriented to person, place, and time.  Psychiatric:        Behavior: Behavior normal.        Thought Content: Thought content normal.     Wt Readings from Last 3 Encounters:  04/15/20 (!) 414 lb (187.8 kg)  09/19/19 (!) 374 lb (169.6 kg)  08/01/19 (!) 351 lb (159.2 kg)    BP 124/78   Pulse 87   Temp 97.7 F (36.5 C) (Oral)   Ht 5\' 6"  (1.676 m)   Wt (!) 414 lb (187.8 kg)   LMP 08/03/2015 Comment: fibroids and endometriosis   SpO2 96%   BMI 66.82 kg/m   Assessment and Plan: 1. Uncontrolled diabetes mellitus with complications (HCC) Did not tolerated Janumet 100/1000 daily - only taking qod Will stop Janumet; begin metformin XR 500 mg daily and Ozempic weekly - Comprehensive metabolic panel - Hemoglobin A1c - metFORMIN (GLUCOPHAGE-XR) 500 MG 24 hr tablet; Take 1 tablet (500 mg total) by mouth daily with breakfast.  Dispense: 30 tablet;  Refill: 5 - Lancets (ONETOUCH DELICA PLUS LANCET30G) MISC; 1 each by Other route in the morning and at bedtime.  Dispense: 100 each; Refill: 3  2. Morbid obesity (HCC) Ozempic may help with weight loss  3. Need for hepatitis C screening test - Hepatitis C antibody   Partially dictated using 01-09-2004. Any errors are unintentional.  Animal nutritionist, MD [facility]  04/15/2020

## 2020-04-16 LAB — HEMOGLOBIN A1C
Est. average glucose Bld gHb Est-mCnc: 166 mg/dL
Hgb A1c MFr Bld: 7.4 % — ABNORMAL HIGH (ref 4.8–5.6)

## 2020-04-16 LAB — COMPREHENSIVE METABOLIC PANEL
ALT: 12 IU/L (ref 0–32)
AST: 11 IU/L (ref 0–40)
Albumin/Globulin Ratio: 1.3 (ref 1.2–2.2)
Albumin: 4 g/dL (ref 3.8–4.8)
Alkaline Phosphatase: 55 IU/L (ref 44–121)
BUN/Creatinine Ratio: 12 (ref 9–23)
BUN: 9 mg/dL (ref 6–20)
Bilirubin Total: 0.3 mg/dL (ref 0.0–1.2)
CO2: 28 mmol/L (ref 20–29)
Calcium: 9.6 mg/dL (ref 8.7–10.2)
Chloride: 100 mmol/L (ref 96–106)
Creatinine, Ser: 0.77 mg/dL (ref 0.57–1.00)
GFR calc Af Amer: 118 mL/min/{1.73_m2} (ref >59)
GFR calc non Af Amer: 102 mL/min/{1.73_m2} (ref >59)
Globulin, Total: 3 g/dL (ref 1.5–4.5)
Glucose: 121 mg/dL — ABNORMAL HIGH (ref 65–99)
Potassium: 4.4 mmol/L (ref 3.5–5.2)
Sodium: 141 mmol/L (ref 134–144)
Total Protein: 7 g/dL (ref 6.0–8.5)

## 2020-04-16 LAB — HEPATITIS C ANTIBODY: Hep C Virus Ab: <0.1 s/co ratio (ref 0.0–0.9)

## 2020-08-13 ENCOUNTER — Encounter: Payer: Self-pay | Admitting: Internal Medicine

## 2020-08-16 ENCOUNTER — Ambulatory Visit: Payer: BC Managed Care – PPO | Admitting: Internal Medicine

## 2020-09-24 ENCOUNTER — Other Ambulatory Visit: Payer: Self-pay

## 2020-09-24 ENCOUNTER — Encounter: Payer: Self-pay | Admitting: Internal Medicine

## 2020-09-24 ENCOUNTER — Ambulatory Visit: Payer: BC Managed Care – PPO | Admitting: Internal Medicine

## 2020-09-24 ENCOUNTER — Other Ambulatory Visit: Payer: Self-pay | Admitting: Internal Medicine

## 2020-09-24 VITALS — BP 128/84 | HR 80 | Temp 97.7°F | Ht 66.0 in | Wt >= 6400 oz

## 2020-09-24 DIAGNOSIS — E1165 Type 2 diabetes mellitus with hyperglycemia: Secondary | ICD-10-CM

## 2020-09-24 DIAGNOSIS — IMO0002 Reserved for concepts with insufficient information to code with codable children: Secondary | ICD-10-CM

## 2020-09-24 DIAGNOSIS — E118 Type 2 diabetes mellitus with unspecified complications: Secondary | ICD-10-CM

## 2020-09-24 LAB — POCT GLYCOSYLATED HEMOGLOBIN (HGB A1C): Hemoglobin A1C: 6.4 % — AB (ref 4.0–5.6)

## 2020-09-24 MED ORDER — SEMAGLUTIDE-WEIGHT MANAGEMENT 2.4 MG/0.75ML ~~LOC~~ SOAJ: 2.4000 mg | SUBCUTANEOUS | 0 refills | Status: DC

## 2020-09-24 NOTE — Progress Notes (Signed)
Date:  09/24/2020   Name:  Chelsea Willis   DOB:  07/17/1985   MRN:  [id number]   Chief Complaint: Diabetes (BS 82 this morning )  Diabetes She presents for her follow-up diabetic visit. She has type 2 diabetes mellitus. Her disease course has been stable. Pertinent negatives for hypoglycemia include no headaches or tremors. Pertinent negatives for diabetes include no chest pain, no fatigue, no polydipsia and no polyuria. Current diabetic treatments: metformin and ozempic. She is compliant with treatment most of the time. An ACE inhibitor/angiotensin II receptor blocker is not being taken.  She has not lost any weight but has not gained either since starting ozempic.  No side effects noted.  Lab Results  Component Value Date   CREATININE 0.77 04/15/2020   BUN 9 04/15/2020   NA 141 04/15/2020   K 4.4 04/15/2020   CL 100 04/15/2020   CO2 28 04/15/2020   Lab Results  Component Value Date   CHOL 170 07/25/2019   HDL 50 07/25/2019   LDLCALC 98 07/25/2019   TRIG 125 07/25/2019   CHOLHDL 3.4 07/25/2019   Lab Results  Component Value Date   TSH 0.842 07/25/2019   Lab Results  Component Value Date   HGBA1C 6.4 (A) 09/24/2020   Lab Results  Component Value Date   WBC 5.8 07/25/2019   HGB 15.5 07/25/2019   HCT 47.8 (H) 07/25/2019   MCV 84 07/25/2019   PLT 333 07/25/2019   Lab Results  Component Value Date   ALT 12 04/15/2020   AST 11 04/15/2020   ALKPHOS 55 04/15/2020   BILITOT 0.3 04/15/2020     Review of Systems  Constitutional: Negative for appetite change, fatigue, fever and unexpected weight change.  Eyes: Negative for visual disturbance.  Respiratory: Negative for cough, chest tightness and shortness of breath.   Cardiovascular: Negative for chest pain, palpitations and leg swelling.  Gastrointestinal: Negative for abdominal pain.  Endocrine: Negative for polydipsia and polyuria.  Genitourinary: Negative for dysuria and hematuria.  Musculoskeletal:  Negative for arthralgias.  Allergic/Immunologic: Positive for environmental allergies.  Neurological: Negative for tremors, numbness and headaches.  Psychiatric/Behavioral: Negative for dysphoric mood.    Patient Active Problem List   Diagnosis Date Noted  . Uncontrolled diabetes mellitus with complications (HCC) 09/06/2019  . OSA on CPAP 09/07/2017  . Mass of axilla 12/16/2015  . S/P laparoscopic hysterectomy 12/16/2015  . Morbid obesity (HCC) 11/03/2013    Allergies  Allergen Reactions  . Hydrocodone-Acetaminophen Nausea And Vomiting    Past Surgical History:  Procedure Laterality Date  . ABDOMINAL HYSTERECTOMY  09/2015   with left salpingectomy  . CESAREAN SECTION  2015  . MYOMECTOMY Right    2012  . OOPHORECTOMY Right 2012    Social History   Tobacco Use  . Smoking status: Never Smoker  . Smokeless tobacco: Never Used  Vaping Use  . Vaping Use: Never used  Substance Use Topics  . Alcohol use: No  . Drug use: No     Medication list has been reviewed and updated.  Current Meds  Medication Sig  . Lancets (ONETOUCH DELICA PLUS LANCET30G) MISC 1 each by Other route in the morning and at bedtime.  . metFORMIN (GLUCOPHAGE-XR) 500 MG 24 hr tablet Take 1 tablet (500 mg total) by mouth daily with breakfast.  . ONETOUCH VERIO test strip SMARTSIG:Via Meter 1 to 4 Times Daily  . Semaglutide-Weight Management 2.4 MG/0.75ML SOAJ Inject 2.4 mg into the skin once a  week.  . [DISCONTINUED] OZEMPIC, 1 MG/DOSE, 4 MG/3ML SOPN Inject 1 mg into the skin once a week.  . [DISCONTINUED] Semaglutide, 1 MG/DOSE, (OZEMPIC, 1 MG/DOSE,) 2 MG/1.5ML SOPN Inject 1 mg into the skin once a week.    PHQ 2/9 Scores 09/24/2020 04/15/2020 09/19/2019 07/25/2019  PHQ - 2 Score 0 0 0 0  PHQ- 9 Score 1 3 0 -    GAD 7 : Generalized Anxiety Score 09/24/2020 04/15/2020 09/19/2019 12/16/2015  Nervous, Anxious, on Edge 0 0 0 1  Control/stop worrying 0 0 0 0  Worry too much - different things 0 0 0 0   Trouble relaxing 0 0 0 3  Restless 0 0 0 2  Easily annoyed or irritable 1 0 0 3  Afraid - awful might happen 0 0 0 1  Total GAD 7 Score 1 0 0 10  Anxiety Difficulty - Not difficult at all Not difficult at all Not difficult at all    BP Readings from Last 3 Encounters:  09/24/20 128/84  04/15/20 124/78  09/19/19 130/76    Physical Exam Vitals and nursing note reviewed.  Constitutional:      General: She is not in acute distress.    Appearance: She is well-developed. She is obese.  HENT:     Head: Normocephalic and atraumatic.  Cardiovascular:     Rate and Rhythm: Normal rate and regular rhythm.     Heart sounds: No murmur heard.   Pulmonary:     Effort: Pulmonary effort is normal. No respiratory distress.  Musculoskeletal:     Cervical back: Normal range of motion.     Right lower leg: No edema.     Left lower leg: No edema.  Lymphadenopathy:     Cervical: No cervical adenopathy.  Skin:    General: Skin is warm and dry.     Findings: No rash.  Neurological:     Mental Status: She is alert and oriented to person, place, and time.  Psychiatric:        Mood and Affect: Mood normal.        Behavior: Behavior normal.     Wt Readings from Last 3 Encounters:  09/24/20 (!) 416 lb (188.7 kg)  04/15/20 (!) 414 lb (187.8 kg)  09/19/19 (!) 374 lb (169.6 kg)    BP 128/84   Pulse 80   Temp 97.7 F (36.5 C) (Oral)   Ht 5\' 6"  (1.676 m)   Wt (!) 416 lb (188.7 kg)   LMP 08/03/2015 Comment: fibroids and endometriosis   SpO2 97%   BMI 67.14 kg/m   Assessment and Plan: 1. Uncontrolled diabetes mellitus with complications (HCC) Clinically stable by exam and report without s/s of hypoglycemia. DM complicated by obesity. Tolerating medications well without side effects or other concerns. Will increase dose of Ozempic to 2.4 mg weekly.  If not covered by insurance, continue ozempic 1 mg. - Semaglutide-Weight Management 2.4 MG/0.75ML SOAJ; Inject 2.4 mg into the skin once a  week.  Dispense: 3 mL; Refill: 0 - POCT glycosylated hemoglobin (Hb A1C) - Microalbumin / creatinine urine ratio  2. Morbid obesity (HCC) - Semaglutide-Weight Management 2.4 MG/0.75ML SOAJ; Inject 2.4 mg into the skin once a week.  Dispense: 3 mL; Refill: 0   Partially dictated using 08/05/2015. Any errors are unintentional.  Animal nutritionist, MD [facility]  09/24/2020

## 2020-09-25 LAB — MICROALBUMIN / CREATININE URINE RATIO
Creatinine, Urine: 119.1 mg/dL
Microalb/Creat Ratio: 3 mg/g creat (ref 0–29)
Microalbumin, Urine: 3.4 ug/mL

## 2020-09-27 ENCOUNTER — Telehealth: Payer: Self-pay

## 2020-09-27 NOTE — Telephone Encounter (Signed)
PA completed waiting for insurance approval.  Key: [id number]  KP

## 2020-09-27 NOTE — Telephone Encounter (Signed)
Called pt left vm to call back. Need to verify insurance.  Will route this note to [facility] Nurse just in case the patient returns call to clinic. [facility] Nurse may give patient results if pt returns the call.  CRM has also been created for this msg for call center purposes. Please attach any notes regarding this lab to this result message and do not create a new CRM.    KP

## 2020-09-30 NOTE — Telephone Encounter (Signed)
Patient PA was denied. Sent patient my chart message explaining how to get the discount card off there website. Placed denied PA in scan pile to be scanned in patient chart.

## 2020-10-23 ENCOUNTER — Other Ambulatory Visit: Payer: Self-pay

## 2020-10-23 ENCOUNTER — Other Ambulatory Visit: Payer: Self-pay | Admitting: Internal Medicine

## 2020-10-23 DIAGNOSIS — E1165 Type 2 diabetes mellitus with hyperglycemia: Secondary | ICD-10-CM

## 2020-10-23 DIAGNOSIS — IMO0002 Reserved for concepts with insufficient information to code with codable children: Secondary | ICD-10-CM

## 2020-10-23 MED ORDER — OZEMPIC (2 MG/DOSE) 8 MG/3ML ~~LOC~~ SOPN
2.0000 mg | PEN_INJECTOR | SUBCUTANEOUS | 1 refills | Status: DC
Start: 2020-10-23 — End: 2021-10-08

## 2020-10-23 MED ORDER — HYDROCORTISONE ACETATE 25 MG RE SUPP: 25.0000 mg | Freq: Two times a day (BID) | RECTAL | 0 refills | Status: DC

## 2020-10-25 ENCOUNTER — Telehealth: Payer: Self-pay

## 2020-10-25 NOTE — Telephone Encounter (Signed)
Completed PA and faxed it to [facility] for Ozempic. Informed patient. Just waiting for outcome to be sent to clinic.

## 2020-10-29 NOTE — Telephone Encounter (Signed)
Patients PA has been approved by patients insurance from 10/28/20-10/28/2021 for Ozempic 2mg  [place].

## 2020-11-05 ENCOUNTER — Encounter: Payer: Self-pay | Admitting: Internal Medicine

## 2021-01-24 ENCOUNTER — Ambulatory Visit: Payer: BC Managed Care – PPO | Admitting: Internal Medicine

## 2021-02-05 ENCOUNTER — Ambulatory Visit
Admission: RE | Admit: 2021-02-05 | Discharge: 2021-02-05 | Disposition: A | Discharge status: Home / Self Care | Payer: BC Managed Care – PPO | Source: Ambulatory Visit | Attending: Sports Medicine | Admitting: Sports Medicine

## 2021-02-05 ENCOUNTER — Other Ambulatory Visit: Payer: Self-pay

## 2021-02-05 VITALS — BP 145/90 | HR 76 | Temp 98.7°F | Resp 18 | Ht 65.0 in | Wt 389.0 lb

## 2021-02-05 DIAGNOSIS — R1030 Lower abdominal pain, unspecified: Secondary | ICD-10-CM

## 2021-02-05 DIAGNOSIS — R3915 Urgency of urination: Secondary | ICD-10-CM

## 2021-02-05 DIAGNOSIS — R102 Pelvic and perineal pain: Secondary | ICD-10-CM

## 2021-02-05 DIAGNOSIS — M545 Low back pain, unspecified: Secondary | ICD-10-CM

## 2021-02-05 DIAGNOSIS — N390 Urinary tract infection, site not specified: Secondary | ICD-10-CM

## 2021-02-05 DIAGNOSIS — R350 Frequency of micturition: Secondary | ICD-10-CM

## 2021-02-05 DIAGNOSIS — K5900 Constipation, unspecified: Secondary | ICD-10-CM

## 2021-02-05 LAB — URINALYSIS, COMPLETE (UACMP) WITH MICROSCOPIC
Glucose, UA: NEGATIVE mg/dL
Hgb urine dipstick: NEGATIVE
Nitrite: NEGATIVE
Specific Gravity, Urine: 1.025 (ref 1.005–1.030)
pH: 6 (ref 5.0–8.0)

## 2021-02-05 MED ORDER — NITROFURANTOIN MONOHYD MACRO 100 MG PO CAPS: 100.0000 mg | ORAL_CAPSULE | Freq: Two times a day (BID) | ORAL | 0 refills | Status: DC

## 2021-02-05 MED ORDER — PHENAZOPYRIDINE HCL 200 MG PO TABS: 200.0000 mg | ORAL_TABLET | Freq: Three times a day (TID) | ORAL | 0 refills | Status: DC

## 2021-02-05 NOTE — Discharge Instructions (Addendum)
As we discussed, your urinalysis is concerning for a urinary tract infection.  I am going to go ahead and treat you for this.  I sent off a urine culture.  Antibiotic was also sent to the pharmacy. As we discussed, for your constipation given the side effect of the new diabetes medicine, I have recommended that you increase your fruit and fiber intake.  You may also want to consider a stool softener over-the-counter, or daily MiraLAX to make sure that you are not straining.  It would be important with your history of hemorrhoids to not strain while having a bowel movement.  Just monitor your symptoms. I believe your back pain is probably coming from the urine issues.  Keep a close eye on that and make sure that that does not progress as it may be a sign of an a sending infection called pyelonephritis. If your back pain or urinary symptoms were to worsen then please go to the ER. If your symptoms were to not improve then please seek out care at any medical facility or go to your primary care provider, or come back here. Please see educational handouts.

## 2021-02-05 NOTE — ED Provider Notes (Signed)
[facility]    CSN: [id number] Arrival date & time: 02/05/21  1249      History   Chief Complaint Chief Complaint  Patient presents with   Abdominal Pain   Back Pain   Constipation    HPI Chelsea Willis is a 35 y.o. female.   Patient is a 35 year old female who presents for evaluation of lower abdominal pain and pressure.  Began yesterday.  No fever shakes chills.  She has a little bit of low back pain but she would not really describe it as flank pain.  She is also had some constipation which she attributes to a new diabetes med that she has been taking.  She has a history of hemorrhoids and did have some blood on the toilet paper a few days ago.  No active bleeding with her latest BM.  She denies any significant abdominal pain or epigastric pain.  No nausea or vomiting or diarrhea.  She has no COVID exposure.  With a low back issue she has no numbness or tingling or any radicular symptoms.  She denies any vaginal discharge or concern for STD.  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  She had to call out of work today and is looking for a work note.  Normally sees Dr. Judithann Graves for ongoing medical care but could not get in.  No chest pain or shortness of breath.  No red flag signs or symptoms elicited on history.   Past Medical History:  Diagnosis Date   Endometriosis     Patient Active Problem List   Diagnosis Date Noted   Uncontrolled diabetes mellitus with complications (HCC) 09/06/2019   OSA on CPAP 09/07/2017   Mass of axilla 12/16/2015   S/P laparoscopic hysterectomy 12/16/2015   Morbid obesity (HCC) 11/03/2013    Past Surgical History:  Procedure Laterality Date   ABDOMINAL HYSTERECTOMY  09/2015   with left salpingectomy   CESAREAN SECTION  2015   MYOMECTOMY Right    2012   OOPHORECTOMY Right 2012    OB History   No obstetric history on file.      Home Medications    Prior to Admission medications   Medication Sig Start  Date End Date Taking? Authorizing Provider  metFORMIN (GLUCOPHAGE-XR) 500 MG 24 hr tablet Take 1 tablet (500 mg total) by mouth daily with breakfast. 04/15/20  Yes Reubin Milan, MD  nitrofurantoin, macrocrystal-monohydrate, (MACROBID) 100 MG capsule Take 1 capsule (100 mg total) by mouth 2 (two) times daily. 02/05/21  Yes Delton See, MD  phenazopyridine (PYRIDIUM) 200 MG tablet Take 1 tablet (200 mg total) by mouth 3 (three) times daily. 02/05/21  Yes Delton See, MD  Semaglutide, 2 MG/DOSE, (OZEMPIC, 2 MG/DOSE,) 8 MG/3ML SOPN Inject 2 mg into the skin once a week. 10/23/20  Yes Reubin Milan, MD  hydrocortisone (ANUSOL-HC) 25 MG suppository Place 1 suppository (25 mg total) rectally 2 (two) times daily. 10/23/20   Reubin Milan, MD  Lancets [facility] DELICA PLUS LANCET30G) MISC 1 each by Other route in the morning and at bedtime. 04/15/20   Reubin Milan, MD  [facility] VERIO test strip SMARTSIG:Via Meter 1 to 4 Times Daily 10/02/19   [provider]    Family History Family History  Problem Relation Age of Onset   Hypertension Father    Hypertension Maternal Grandmother    Drug abuse Maternal Grandmother    Hypertension Paternal Grandmother    Drug abuse Paternal Grandmother  Hypertension Paternal Grandfather     Social History Social History   Tobacco Use   Smoking status: Never   Smokeless tobacco: Never  Vaping Use   Vaping Use: Never used  Substance Use Topics   Alcohol use: No   Drug use: No     Allergies   Hydrocodone-acetaminophen   Review of Systems Review of Systems  Constitutional:  Negative for activity change, appetite change, chills, diaphoresis, fatigue and fever.  HENT:  Negative for congestion, ear pain, postnasal drip, rhinorrhea, sinus pressure, sinus pain, sneezing and sore throat.   Eyes:  Negative for pain.  Respiratory:  Negative for cough, chest tightness and shortness of breath.   Cardiovascular:  Negative for chest  pain and palpitations.  Gastrointestinal:  Positive for abdominal pain, anal bleeding and constipation. Negative for diarrhea, nausea, rectal pain and vomiting.  Genitourinary:  Positive for flank pain, frequency and urgency. Negative for dysuria, hematuria, vaginal bleeding, vaginal discharge and vaginal pain.  Musculoskeletal:  Positive for back pain. Negative for myalgias, neck pain and neck stiffness.  Skin:  Negative for color change, pallor, rash and wound.  Neurological:  Negative for dizziness, light-headedness, numbness and headaches.  All other systems reviewed and are negative.   Physical Exam Triage Vital Signs ED Triage Vitals  Enc Vitals Group     BP 02/05/21 1341 (!) 145/90     Pulse Rate 02/05/21 1341 76     Resp 02/05/21 1341 18     Temp 02/05/21 1341 98.7 F (37.1 C)     Temp Source 02/05/21 1341 Oral     SpO2 02/05/21 1341 98 %     Weight 02/05/21 1340 (!) 389 lb (176.4 kg)     Height 02/05/21 1340 5\' 5"  (1.651 m)     Head Circumference --      Peak Flow --      Pain Score 02/05/21 1338 7     Pain Loc --      Pain Edu? --      Excl. in GC? --    No data found.  Updated Vital Signs BP (!) 145/90 (BP Location: Left Arm)   Pulse 76   Temp 98.7 F (37.1 C) (Oral)   Resp 18   Ht 5\' 5"  (1.651 m)   Wt (!) 176.4 kg   LMP 08/03/2015 Comment: fibroids and endometriosis   SpO2 98%   BMI 64.73 kg/m   Visual Acuity Right Eye Distance:   Left Eye Distance:   Bilateral Distance:    Right Eye Near:   Left Eye Near:    Bilateral Near:     Physical Exam Vitals and nursing note reviewed.  Constitutional:      General: She is not in acute distress.    Appearance: Normal appearance. She is well-developed. She is obese. She is not ill-appearing, toxic-appearing or diaphoretic.  HENT:     Head: Normocephalic and atraumatic.     Nose: Nose normal.     Mouth/Throat:     Mouth: Mucous membranes are moist.     Pharynx: No pharyngeal swelling.  Eyes:      General: No scleral icterus.    Extraocular Movements: Extraocular movements intact.     Conjunctiva/sclera: Conjunctivae normal.     Pupils: Pupils are equal, round, and reactive to light.  Cardiovascular:     Rate and Rhythm: Normal rate and regular rhythm.     Pulses: Normal pulses.     Heart sounds: Normal heart sounds. No  murmur heard.   No friction rub. No gallop.  Pulmonary:     Effort: Pulmonary effort is normal.     Breath sounds: Normal breath sounds. No stridor. No wheezing, rhonchi or rales.  Abdominal:     General: Bowel sounds are normal. There is no abdominal bruit. There are no signs of injury.     Palpations: Abdomen is soft. There is hepatomegaly. There is no shifting dullness, fluid wave or splenomegaly.     Tenderness: There is abdominal tenderness in the suprapubic area. There is no right CVA tenderness, left CVA tenderness, guarding or rebound. Negative signs include Murphy's sign and McBurney's sign.  Musculoskeletal:     Cervical back: Normal range of motion and neck supple.  Skin:    General: Skin is warm and dry.     Capillary Refill: Capillary refill takes less than 2 seconds.     Coloration: Skin is not jaundiced.     Findings: No erythema or rash.  Neurological:     General: No focal deficit present.     Mental Status: She is alert and oriented to person, place, and time.     UC Treatments / Results  Labs (all labs ordered are listed, but only abnormal results are displayed) Labs Reviewed  URINALYSIS, COMPLETE (UACMP) WITH MICROSCOPIC - Abnormal; Notable for the following components:      Result Value   Bilirubin Urine SMALL (*)    Ketones, ur TRACE (*)    Protein, ur TRACE (*)    Leukocytes,Ua TRACE (*)    Bacteria, UA MANY (*)    All other components within normal limits  URINE CULTURE    EKG   Radiology No results found.  Procedures Procedures (including critical care time)  Medications Ordered in UC Medications - No data to  display  Initial Impression / Assessment and Plan / UC Course  I have reviewed the triage vital signs and the nursing notes.  Pertinent labs & imaging results that were available during my care of the patient were reviewed by me and considered in my medical decision making (see chart for details).  Clinical impression: 1.  Lower abdominal pain 2.  Suprapubic pressure 3.  Urinary frequency and urgency 4.  Constipation 5.  Low back pain probably related to some urinary issue.  Treatment plan: 1.  The findings and treatment plan were discussed in detail with the patient.  Patient was in agreement. 2.  Recommended getting a UA.  It did show leukocytes as well as increased bacteria.  We will treat for a urinary tract infection.  Sent in an antibiotic and Pyridium. 3.  For constipation I recommended plenty of fluids including water, increase fruit and fiber content in the diet.  She can also use over-the-counter stool softeners or MiraLAX. 4.  Educational handouts were provided. 5.  Did discuss the red flag signs and symptoms and what to watch for in terms of an ascending urinary tract infection or pyelonephritis.  If that was to occur I have advised her to go to the ER.  She voiced verbal understanding. 6.  If symptoms persist of asked her to see her primary care provider. 7.  If symptoms were to worsen in any way she should go to the ER. 8.  She was stable upon discharge and will follow-up here as needed.    Final Clinical Impressions(s) / UC Diagnoses   Final diagnoses:  Lower abdominal pain  Suprapubic pressure  Urinary frequency  Urinary urgency  Constipation, unspecified constipation type  Lower urinary tract infectious disease  Low back pain, unspecified back pain laterality, unspecified chronicity, unspecified whether sciatica present     Discharge Instructions      As we discussed, your urinalysis is concerning for a urinary tract infection.  I am going to go ahead and  treat you for this.  I sent off a urine culture.  Antibiotic was also sent to the pharmacy. As we discussed, for your constipation given the side effect of the new diabetes medicine, I have recommended that you increase your fruit and fiber intake.  You may also want to consider a stool softener over-the-counter, or daily MiraLAX to make sure that you are not straining.  It would be important with your history of hemorrhoids to not strain while having a bowel movement.  Just monitor your symptoms. I believe your back pain is probably coming from the urine issues.  Keep a close eye on that and make sure that that does not progress as it may be a sign of an a sending infection called pyelonephritis. If your back pain or urinary symptoms were to worsen then please go to the ER. If your symptoms were to not improve then please seek out care at any medical facility or go to your primary care provider, or come back here. Please see educational handouts.     ED Prescriptions     Medication Sig Dispense Auth. Provider   nitrofurantoin, macrocrystal-monohydrate, (MACROBID) 100 MG capsule Take 1 capsule (100 mg total) by mouth 2 (two) times daily. 10 capsule Delton SeeBarnes, Kamori Kitchens, MD   phenazopyridine (PYRIDIUM) 200 MG tablet Take 1 tablet (200 mg total) by mouth 3 (three) times daily. 6 tablet Delton SeeBarnes, Krishiv Sandler, MD      PDMP not reviewed this encounter.   Delton SeeBarnes, Eluterio Seymour, MD 02/05/21 [id number]

## 2021-02-05 NOTE — ED Triage Notes (Signed)
Pt c/o lower back pain, constipation, for several days; bladder pressure since yesterday. Pt also reports some blood when trying to have a BM, states it is brownish red, not bright red. Pt reports last BM was yesterday, more normal, no blood present. Pt also reports one bout of emesis today, occasional nausea. Pt recently started Ozempic and thinks this may be contributing. Pt is on her 4th week/dose. Pt does have hx of hemorrhoids.

## 2021-02-07 LAB — URINE CULTURE

## 2021-02-25 ENCOUNTER — Ambulatory Visit: Payer: BC Managed Care – PPO | Admitting: Internal Medicine

## 2021-02-26 ENCOUNTER — Encounter: Payer: BC Managed Care – PPO | Admitting: Internal Medicine

## 2021-03-06 ENCOUNTER — Encounter: Payer: Self-pay | Admitting: Internal Medicine

## 2021-03-06 ENCOUNTER — Encounter: Payer: BC Managed Care – PPO | Admitting: Internal Medicine

## 2021-03-06 NOTE — Progress Notes (Deleted)
Date:  03/06/2021   Name:  Chelsea Willis   DOB:  19-Aug-1985   MRN:  [id number]   Chief Complaint: No chief complaint on file. Chelsea Willis is a 35 y.o. female who presents today for her Complete Annual Exam. She feels {DESC; WELL/FAIRLY WELL/POORLY:18703}. She reports exercising ***. She reports she is sleeping {DESC; WELL/FAIRLY WELL/POORLY:18703}. Breast complaints ***.  Pap smear: s/p hysterectomy   Immunization History  Administered Date(s) Administered   Influenza,inj,Quad PF,6+ Mos 02/15/2014, 09/06/2015   Influenza-Unspecified 03/28/2019, 03/22/2020   PFIZER(Purple Top)SARS-COV-2 Vaccination 07/21/2019, 08/11/2019, 06/21/2020   Tdap 03/15/2014    Diabetes She presents for her follow-up diabetic visit. She has type 2 diabetes mellitus. Pertinent negatives for hypoglycemia include no dizziness, headaches, nervousness/anxiousness or tremors. Pertinent negatives for diabetes include no chest pain, no fatigue, no polydipsia and no polyuria. Current diabetic treatment includes oral agent (monotherapy) (metformin and Ozempic 2 mg).   Lab Results  Component Value Date   CREATININE 0.77 04/15/2020   BUN 9 04/15/2020   NA 141 04/15/2020   K 4.4 04/15/2020   CL 100 04/15/2020   CO2 28 04/15/2020   Lab Results  Component Value Date   CHOL 170 07/25/2019   HDL 50 07/25/2019   LDLCALC 98 07/25/2019   TRIG 125 07/25/2019   CHOLHDL 3.4 07/25/2019   Lab Results  Component Value Date   TSH 0.842 07/25/2019   Lab Results  Component Value Date   HGBA1C 6.4 (A) 09/24/2020   Lab Results  Component Value Date   WBC 5.8 07/25/2019   HGB 15.5 07/25/2019   HCT 47.8 (H) 07/25/2019   MCV 84 07/25/2019   PLT 333 07/25/2019   Lab Results  Component Value Date   ALT 12 04/15/2020   AST 11 04/15/2020   ALKPHOS 55 04/15/2020   BILITOT 0.3 04/15/2020     Review of Systems  Constitutional:  Negative for chills, fatigue and fever.  HENT:  Negative for  congestion, hearing loss, tinnitus, trouble swallowing and voice change.   Eyes:  Negative for visual disturbance.  Respiratory:  Negative for cough, chest tightness, shortness of breath and wheezing.   Cardiovascular:  Negative for chest pain, palpitations and leg swelling.  Gastrointestinal:  Negative for abdominal pain, constipation, diarrhea and vomiting.  Endocrine: Negative for polydipsia and polyuria.  Genitourinary:  Negative for dysuria, frequency, genital sores, vaginal bleeding and vaginal discharge.  Musculoskeletal:  Negative for arthralgias, gait problem and joint swelling.  Skin:  Negative for color change and rash.  Neurological:  Negative for dizziness, tremors, light-headedness and headaches.  Hematological:  Negative for adenopathy. Does not bruise/bleed easily.  Psychiatric/Behavioral:  Negative for dysphoric mood and sleep disturbance. The patient is not nervous/anxious.    Patient Active Problem List   Diagnosis Date Noted   Uncontrolled diabetes mellitus with complications (HCC) 09/06/2019   OSA on CPAP 09/07/2017   Mass of axilla 12/16/2015   S/P laparoscopic hysterectomy 12/16/2015   Morbid obesity (HCC) 11/03/2013    Allergies  Allergen Reactions   Hydrocodone-Acetaminophen Nausea And Vomiting    Past Surgical History:  Procedure Laterality Date   ABDOMINAL HYSTERECTOMY  09/2015   with left salpingectomy   CESAREAN SECTION  2015   MYOMECTOMY Right    2012   OOPHORECTOMY Right 2012    Social History   Tobacco Use   Smoking status: Never   Smokeless tobacco: Never  Vaping Use   Vaping Use: Never used  Substance Use Topics  Alcohol use: No   Drug use: No     Medication list has been reviewed and updated.  No outpatient medications have been marked as taking for the 03/06/21 encounter (Appointment) with Reubin Milan, MD.    [facility] 2/9 Scores 09/24/2020 04/15/2020 09/19/2019 07/25/2019  PHQ - 2 Score 0 0 0 0  PHQ- 9 Score 1 3 0 -    GAD 7  : Generalized Anxiety Score 09/24/2020 04/15/2020 09/19/2019 12/16/2015  Nervous, Anxious, on Edge 0 0 0 1  Control/stop worrying 0 0 0 0  Worry too much - different things 0 0 0 0  Trouble relaxing 0 0 0 3  Restless 0 0 0 2  Easily annoyed or irritable 1 0 0 3  Afraid - awful might happen 0 0 0 1  Total GAD 7 Score 1 0 0 10  Anxiety Difficulty - Not difficult at all Not difficult at all Not difficult at all    BP Readings from Last 3 Encounters:  02/05/21 (!) 145/90  09/24/20 128/84  04/15/20 124/78    Physical Exam Vitals and nursing note reviewed.  Constitutional:      General: She is not in acute distress.    Appearance: She is well-developed.  HENT:     Head: Normocephalic and atraumatic.     Right Ear: Tympanic membrane and ear canal normal.     Left Ear: Tympanic membrane and ear canal normal.     Nose:     Right Sinus: No maxillary sinus tenderness.     Left Sinus: No maxillary sinus tenderness.  Eyes:     General: No scleral icterus.       Right eye: No discharge.        Left eye: No discharge.     Conjunctiva/sclera: Conjunctivae normal.  Neck:     Thyroid: No thyromegaly.     Vascular: No carotid bruit.  Cardiovascular:     Rate and Rhythm: Normal rate and regular rhythm.     Pulses: Normal pulses.     Heart sounds: Normal heart sounds.  Pulmonary:     Effort: Pulmonary effort is normal. No respiratory distress.     Breath sounds: No wheezing.  Chest:  Breasts:    Right: No mass, nipple discharge, skin change or tenderness.     Left: No mass, nipple discharge, skin change or tenderness.  Abdominal:     General: Bowel sounds are normal.     Palpations: Abdomen is soft.     Tenderness: There is no abdominal tenderness.  Musculoskeletal:     Cervical back: Normal range of motion. No erythema.     Right lower leg: No edema.     Left lower leg: No edema.  Lymphadenopathy:     Cervical: No cervical adenopathy.  Skin:    General: Skin is warm and dry.      Findings: No rash.  Neurological:     Mental Status: She is alert and oriented to person, place, and time.     Cranial Nerves: No cranial nerve deficit.     Sensory: No sensory deficit.     Deep Tendon Reflexes: Reflexes are normal and symmetric.  Psychiatric:        Attention and Perception: Attention normal.        Mood and Affect: Mood normal.    Wt Readings from Last 3 Encounters:  02/05/21 (!) 389 lb (176.4 kg)  09/24/20 (!) 416 lb (188.7 kg)  04/15/20 (!) 414 lb (  187.8 kg)    LMP 08/03/2015 Comment: fibroids and endometriosis   Assessment and Plan:

## 2021-10-08 ENCOUNTER — Encounter: Payer: Self-pay | Admitting: Internal Medicine

## 2021-10-08 ENCOUNTER — Ambulatory Visit (INDEPENDENT_AMBULATORY_CARE_PROVIDER_SITE_OTHER): Payer: BC Managed Care – PPO | Admitting: Internal Medicine

## 2021-10-08 VITALS — BP 126/88 | HR 83 | Ht 66.0 in | Wt >= 6400 oz

## 2021-10-08 DIAGNOSIS — E118 Type 2 diabetes mellitus with unspecified complications: Secondary | ICD-10-CM | POA: Diagnosis not present

## 2021-10-08 DIAGNOSIS — Z90710 Acquired absence of both cervix and uterus: Secondary | ICD-10-CM

## 2021-10-08 MED ORDER — OZEMPIC (2 MG/DOSE) 8 MG/3ML ~~LOC~~ SOPN: 2.0000 mg | PEN_INJECTOR | SUBCUTANEOUS | 0 refills | Status: DC

## 2021-10-08 NOTE — Progress Notes (Signed)
? ? ?Date:  10/08/2021  ? ?Name:  Chelsea Willis   DOB:  19-Jun-1985   MRN:  [id number] ? ? ?Chief Complaint: Diabetes ? ?Diabetes ?She presents for her follow-up diabetic visit. She has type 2 diabetes mellitus. Her disease course has been improving. Pertinent negatives for hypoglycemia include no dizziness, headaches or nervousness/anxiousness. Associated symptoms include weight loss. Pertinent negatives for diabetes include no blurred vision, no chest pain, no fatigue, no foot paresthesias and no foot ulcerations. Current diabetic treatments: ozempic. She is compliant with treatment all of the time.  ? ?Lab Results  ?Component Value Date  ? NA 141 04/15/2020  ? K 4.4 04/15/2020  ? CO2 28 04/15/2020  ? GLUCOSE 121 (H) 04/15/2020  ? BUN 9 04/15/2020  ? CREATININE 0.77 04/15/2020  ? CALCIUM 9.6 04/15/2020  ? GFRNONAA 102 04/15/2020  ? ?Lab Results  ?Component Value Date  ? CHOL 170 07/25/2019  ? HDL 50 07/25/2019  ? LDLCALC 98 07/25/2019  ? TRIG 125 07/25/2019  ? CHOLHDL 3.4 07/25/2019  ? ?Lab Results  ?Component Value Date  ? TSH 0.842 07/25/2019  ? ?Lab Results  ?Component Value Date  ? HGBA1C 6.4 (A) 09/24/2020  ? ?Lab Results  ?Component Value Date  ? WBC 5.8 07/25/2019  ? HGB 15.5 07/25/2019  ? HCT 47.8 (H) 07/25/2019  ? MCV 84 07/25/2019  ? PLT 333 07/25/2019  ? ?Lab Results  ?Component Value Date  ? ALT 12 04/15/2020  ? AST 11 04/15/2020  ? ALKPHOS 55 04/15/2020  ? BILITOT 0.3 04/15/2020  ? ?No results found for: 25OHVITD2, 25OHVITD3, VD25OH  ? ?Review of Systems  ?Constitutional:  Positive for weight loss. Negative for chills, fatigue and unexpected weight change.  ?Eyes:  Negative for blurred vision.  ?Respiratory:  Negative for cough, chest tightness, shortness of breath and wheezing.   ?Cardiovascular:  Negative for chest pain and palpitations.  ?Gastrointestinal:  Negative for constipation and diarrhea.  ?Genitourinary:  Negative for dysuria, frequency and urgency.  ?Musculoskeletal:  Negative for  arthralgias and gait problem.  ?Skin:  Negative for color change and rash.  ?Neurological:  Negative for dizziness, light-headedness and headaches.  ?Psychiatric/Behavioral:  Negative for dysphoric mood and sleep disturbance. The patient is not nervous/anxious.   ? ?Patient Active Problem List  ? Diagnosis Date Noted  ? S/P total hysterectomy 10/08/2021  ? Type II diabetes mellitus with complication (HCC) 09/06/2019  ? OSA on CPAP 09/07/2017  ? Morbid obesity (HCC) 11/03/2013  ? ? ?Allergies  ?Allergen Reactions  ? Hydrocodone-Acetaminophen Nausea And Vomiting  ? ? ?Past Surgical History:  ?Procedure Laterality Date  ? ABDOMINAL HYSTERECTOMY  09/07/2015  ? total with salpingectomy  ? CESAREAN SECTION  06/08/2013  ? MYOMECTOMY Right   ? 2012  ? OOPHORECTOMY Right 06/08/2010  ? ? ?Social History  ? ?Tobacco Use  ? Smoking status: Never  ? Smokeless tobacco: Never  ?Vaping Use  ? Vaping Use: Never used  ?Substance Use Topics  ? Alcohol use: No  ? Drug use: No  ? ? ? ?Medication list has been reviewed and updated. ? ?Current Meds  ?Medication Sig  ? Lancets (ONETOUCH DELICA PLUS LANCET30G) MISC 1 each by Other route in the morning and at bedtime.  ? ONETOUCH VERIO test strip SMARTSIG:Via Meter 1 to 4 Times Daily  ? [DISCONTINUED] hydrocortisone (ANUSOL-HC) 25 MG suppository Place 1 suppository (25 mg total) rectally 2 (two) times daily.  ? [DISCONTINUED] phenazopyridine (PYRIDIUM) 200 MG tablet Take 1 tablet (  200 mg total) by mouth 3 (three) times daily.  ? [DISCONTINUED] Semaglutide, 2 MG/DOSE, (OZEMPIC, 2 MG/DOSE,) 8 MG/3ML SOPN Inject 2 mg into the skin once a week.  ? ? ? ?  10/08/2021  ? 10:45 AM 09/24/2020  ?  8:27 AM 04/15/2020  ?  8:49 AM 09/19/2019  ?  8:02 AM  ?GAD 7 : Generalized Anxiety Score  ?Nervous, Anxious, on Edge 0 0 0 0  ?Control/stop worrying 0 0 0 0  ?Worry too much - different things 0 0 0 0  ?Trouble relaxing 1 0 0 0  ?Restless 0 0 0 0  ?Easily annoyed or irritable 1 1 0 0  ?Afraid - awful might  happen 0 0 0 0  ?Total GAD 7 Score 2 1 0 0  ?Anxiety Difficulty   Not difficult at all Not difficult at all  ? ? ? ?  10/08/2021  ? 10:45 AM  ?Depression screen PHQ 2/9  ?Decreased Interest 0  ?Down, Depressed, Hopeless 0  ?PHQ - 2 Score 0  ?Altered sleeping 0  ?Tired, decreased energy 1  ?Change in appetite 2  ?Feeling bad or failure about yourself  0  ?Trouble concentrating 1  ?Moving slowly or fidgety/restless 0  ?Suicidal thoughts 0  ?PHQ-9 Score 4  ?Difficult doing work/chores Not difficult at all  ? ? ?BP Readings from Last 3 Encounters:  ?10/08/21 126/88  ?02/05/21 (!) 145/90  ?09/24/20 128/84  ? ? ?Physical Exam ?Vitals and nursing note reviewed.  ?Constitutional:   ?   General: She is not in acute distress. ?   Appearance: She is well-developed. She is obese.  ?HENT:  ?   Head: Normocephalic and atraumatic.  ?Neck:  ?   Vascular: No carotid bruit.  ?Cardiovascular:  ?   Rate and Rhythm: Normal rate and regular rhythm.  ?   Pulses: Normal pulses.  ?   Heart sounds: No murmur heard. ?Pulmonary:  ?   Effort: Pulmonary effort is normal. No respiratory distress.  ?   Breath sounds: No wheezing or rhonchi.  ?Musculoskeletal:  ?   Cervical back: Normal range of motion.  ?   Right lower leg: No edema.  ?   Left lower leg: No edema.  ?Lymphadenopathy:  ?   Cervical: No cervical adenopathy.  ?Skin: ?   General: Skin is warm and dry.  ?   Capillary Refill: Capillary refill takes less than 2 seconds.  ?   Findings: No rash.  ?Neurological:  ?   General: No focal deficit present.  ?   Mental Status: She is alert and oriented to person, place, and time.  ?Psychiatric:     ?   Mood and Affect: Mood normal.     ?   Behavior: Behavior normal.  ? ? ?Wt Readings from Last 3 Encounters:  ?10/08/21 (!) 404 lb (183.3 kg)  ?02/05/21 (!) 389 lb (176.4 kg)  ?09/24/20 (!) 416 lb (188.7 kg)  ? ? ?BP 126/88 (BP Location: Right Wrist)   Pulse 83   Ht 5\' 6"  (1.676 m)   Wt (!) 404 lb (183.3 kg)   LMP 08/03/2015 Comment: fibroids and  endometriosis   SpO2 97%   BMI 65.21 kg/m?  ? ?Assessment and Plan: ?1. Type II diabetes mellitus with complication (HCC) ?Clinically stable by exam and report without s/s of hypoglycemia. ?DM complicated by dyslipidemia. ?Tolerating medications well without side effects or other concerns. ?Will check Urine protein and treat with either ACEI or SGLT-2 depending  on A1C ?- CBC with Differential/Platelet ?- Comprehensive metabolic panel ?- Lipid panel ?- Hemoglobin A1c ?- Microalbumin / creatinine urine ratio ?- Semaglutide, 2 MG/DOSE, (OZEMPIC, 2 MG/DOSE,) 8 MG/3ML SOPN; Inject 2 mg into the skin once a week.  Dispense: 9 mL; Refill: 0 ? ?2. Morbid obesity (HCC) ?Continue to work on diet and weight loss. ? ?3. S/P total hysterectomy ?No longer needs Pap or pelvic exams. ? ? ?Partially dictated using Animal nutritionistDragon software. Any errors are unintentional. ? ?Bari EdwardLaura Nalee Lightle, MD ?[facility] ?[facility] ? ?10/08/2021 ? ? ? ? ? ?

## 2021-10-09 LAB — COMPREHENSIVE METABOLIC PANEL
ALT: 12 IU/L (ref 0–32)
AST: 12 IU/L (ref 0–40)
Albumin/Globulin Ratio: 1.2 (ref 1.2–2.2)
Albumin: 4.1 g/dL (ref 3.8–4.8)
Alkaline Phosphatase: 53 IU/L (ref 44–121)
BUN/Creatinine Ratio: 11 (ref 9–23)
BUN: 8 mg/dL (ref 6–20)
Bilirubin Total: 0.3 mg/dL (ref 0.0–1.2)
CO2: 29 mmol/L (ref 20–29)
Calcium: 9.5 mg/dL (ref 8.7–10.2)
Chloride: 108 mmol/L — ABNORMAL HIGH (ref 96–106)
Creatinine, Ser: 0.76 mg/dL (ref 0.57–1.00)
Globulin, Total: 3.4 g/dL (ref 1.5–4.5)
Glucose: 85 mg/dL (ref 70–99)
Potassium: 4.4 mmol/L (ref 3.5–5.2)
Sodium: 146 mmol/L — ABNORMAL HIGH (ref 134–144)
Total Protein: 7.5 g/dL (ref 6.0–8.5)
eGFR: 106 mL/min/{1.73_m2} (ref >59)

## 2021-10-09 LAB — CBC WITH DIFFERENTIAL/PLATELET
Basophils Absolute: 0.1 10*3/uL (ref 0.0–0.2)
Basos: 1 %
EOS (ABSOLUTE): 0.3 10*3/uL (ref 0.0–0.4)
Eos: 3 %
Hematocrit: 43.4 % (ref 34.0–46.6)
Hemoglobin: 13.8 g/dL (ref 11.1–15.9)
Immature Grans (Abs): 0 10*3/uL (ref 0.0–0.1)
Immature Granulocytes: 0 %
Lymphocytes Absolute: 3.5 10*3/uL — ABNORMAL HIGH (ref 0.7–3.1)
Lymphs: 44 %
MCH: 27.1 pg (ref 26.6–33.0)
MCHC: 31.8 g/dL (ref 31.5–35.7)
MCV: 85 fL (ref 79–97)
Monocytes Absolute: 0.6 10*3/uL (ref 0.1–0.9)
Monocytes: 8 %
Neutrophils Absolute: 3.5 10*3/uL (ref 1.4–7.0)
Neutrophils: 44 %
Platelets: 324 10*3/uL (ref 150–450)
RBC: 5.09 x10E6/uL (ref 3.77–5.28)
RDW: 14.8 % (ref 11.7–15.4)
WBC: 8 10*3/uL (ref 3.4–10.8)

## 2021-10-09 LAB — MICROALBUMIN / CREATININE URINE RATIO
Creatinine, Urine: 172.1 mg/dL
Microalb/Creat Ratio: 3 mg/g creat (ref 0–29)
Microalbumin, Urine: 5.5 ug/mL

## 2021-10-09 LAB — HEMOGLOBIN A1C
Est. average glucose Bld gHb Est-mCnc: 131 mg/dL
Hgb A1c MFr Bld: 6.2 % — ABNORMAL HIGH (ref 4.8–5.6)

## 2021-10-09 LAB — LIPID PANEL
Chol/HDL Ratio: 3.3 ratio (ref 0.0–4.4)
Cholesterol, Total: 164 mg/dL (ref 100–199)
HDL: 49 mg/dL (ref >39)
LDL Chol Calc (NIH): 101 mg/dL — ABNORMAL HIGH (ref 0–99)
Triglycerides: 72 mg/dL (ref 0–149)
VLDL Cholesterol Cal: 14 mg/dL (ref 5–40)

## 2021-11-17 ENCOUNTER — Other Ambulatory Visit: Payer: Self-pay | Admitting: Internal Medicine

## 2021-11-17 DIAGNOSIS — E118 Type 2 diabetes mellitus with unspecified complications: Secondary | ICD-10-CM

## 2021-11-17 MED ORDER — OZEMPIC (2 MG/DOSE) 8 MG/3ML ~~LOC~~ SOPN: 2.0000 mg | PEN_INJECTOR | SUBCUTANEOUS | 0 refills | Status: DC

## 2022-01-28 ENCOUNTER — Encounter: Payer: Self-pay | Admitting: Internal Medicine

## 2022-02-11 ENCOUNTER — Ambulatory Visit: Payer: BC Managed Care – PPO | Admitting: Internal Medicine

## 2022-02-23 ENCOUNTER — Ambulatory Visit (INDEPENDENT_AMBULATORY_CARE_PROVIDER_SITE_OTHER): Payer: BC Managed Care – PPO | Admitting: Internal Medicine

## 2022-02-23 ENCOUNTER — Encounter: Payer: Self-pay | Admitting: Internal Medicine

## 2022-02-23 VITALS — BP 132/86 | HR 95 | Ht 66.0 in | Wt >= 6400 oz

## 2022-02-23 DIAGNOSIS — Z23 Encounter for immunization: Secondary | ICD-10-CM | POA: Diagnosis not present

## 2022-02-23 DIAGNOSIS — E1169 Type 2 diabetes mellitus with other specified complication: Secondary | ICD-10-CM | POA: Insufficient documentation

## 2022-02-23 DIAGNOSIS — E118 Type 2 diabetes mellitus with unspecified complications: Secondary | ICD-10-CM

## 2022-02-23 DIAGNOSIS — E785 Hyperlipidemia, unspecified: Secondary | ICD-10-CM

## 2022-02-23 LAB — POCT GLYCOSYLATED HEMOGLOBIN (HGB A1C): Hemoglobin A1C: 7.5 % — AB (ref 4.0–5.6)

## 2022-02-23 MED ORDER — ATORVASTATIN CALCIUM 10 MG PO TABS: 10.0000 mg | ORAL_TABLET | Freq: Every day | ORAL | 1 refills | Status: AC

## 2022-02-23 NOTE — Progress Notes (Signed)
Date:  02/23/2022   Name:  Chelsea Willis   DOB:  23-Aug-1985   MRN:  [id number]   Chief Complaint: Diabetes (Have not taken metformin long than 3 month, was taking a injection and it made pt feel bad )  Diabetes She presents for her follow-up diabetic visit. She has type 2 diabetes mellitus. Her disease course has been stable. Pertinent negatives for hypoglycemia include no headaches or tremors. Pertinent negatives for diabetes include no chest pain, no fatigue, no polydipsia and no polyuria. Current diabetic treatments: metformin and ozempic. Her breakfast blood glucose is taken between 7-8 am. Her breakfast blood glucose range is generally 90-110 mg/dl. Eye exam is current.  Hyperlipidemia This is a chronic problem. The problem is uncontrolled. Recent lipid tests were reviewed and are high. Exacerbating diseases include diabetes. Pertinent negatives include no chest pain or shortness of breath. She is currently on no antihyperlipidemic treatment.  She has gained quite a bit of weight over the past 6 months.  She has not considered gastric bypass.  She might be interested in Medical weight loss in [place].  Lab Results  Component Value Date   NA 146 (H) 10/08/2021   K 4.4 10/08/2021   CO2 29 10/08/2021   GLUCOSE 85 10/08/2021   BUN 8 10/08/2021   CREATININE 0.76 10/08/2021   CALCIUM 9.5 10/08/2021   EGFR 106 10/08/2021   GFRNONAA 102 04/15/2020   Lab Results  Component Value Date   CHOL 164 10/08/2021   HDL 49 10/08/2021   LDLCALC 101 (H) 10/08/2021   TRIG 72 10/08/2021   CHOLHDL 3.3 10/08/2021   Lab Results  Component Value Date   TSH 0.842 07/25/2019   Lab Results  Component Value Date   HGBA1C 7.5 (A) 02/23/2022   Lab Results  Component Value Date   WBC 8.0 10/08/2021   HGB 13.8 10/08/2021   HCT 43.4 10/08/2021   MCV 85 10/08/2021   PLT 324 10/08/2021   Lab Results  Component Value Date   ALT 12 10/08/2021   AST 12 10/08/2021   ALKPHOS 53  10/08/2021   BILITOT 0.3 10/08/2021   No results found for: "25OHVITD2", "25OHVITD3", "VD25OH"   Review of Systems  Constitutional:  Negative for appetite change, fatigue, fever and unexpected weight change.  HENT:  Negative for tinnitus and trouble swallowing.   Eyes:  Negative for visual disturbance.  Respiratory:  Negative for cough, chest tightness and shortness of breath.   Cardiovascular:  Negative for chest pain, palpitations and leg swelling.  Gastrointestinal:  Negative for abdominal pain.  Endocrine: Negative for polydipsia and polyuria.  Genitourinary:  Negative for dysuria and hematuria.  Musculoskeletal:  Negative for arthralgias.  Neurological:  Negative for tremors, numbness and headaches.  Psychiatric/Behavioral:  Negative for dysphoric mood.     Patient Active Problem List   Diagnosis Date Noted   Hyperlipidemia associated with type 2 diabetes mellitus ([place]) 02/23/2022   S/P total hysterectomy 10/08/2021   Type II diabetes mellitus with complication ([place]) 79/48/0165   OSA on CPAP 09/07/2017   Morbid obesity ([place]) 11/03/2013    Allergies  Allergen Reactions   Hydrocodone-Acetaminophen Nausea And Vomiting    Past Surgical History:  Procedure Laterality Date   ABDOMINAL HYSTERECTOMY  09/07/2015   total with salpingectomy   CESAREAN SECTION  06/08/2013   MYOMECTOMY Right    2012   OOPHORECTOMY Right 06/08/2010    Social History   Tobacco Use   Smoking status: Never  Smokeless tobacco: Never  Vaping Use   Vaping Use: Never used  Substance Use Topics   Alcohol use: No   Drug use: No     Medication list has been reviewed and updated.  Current Meds  Medication Sig   atorvastatin (LIPITOR) 10 MG tablet Take 1 tablet (10 mg total) by mouth daily.   Lancets (ONETOUCH DELICA PLUS HFGBMS11D) MISC 1 each by Other route in the morning and at bedtime.   ONETOUCH VERIO test strip SMARTSIG:Via Meter 1 to 4 Times Daily   Semaglutide, 2 MG/DOSE, (OZEMPIC,  2 MG/DOSE,) 8 MG/3ML SOPN Inject 2 mg into the skin once a week.       02/23/2022    1:26 PM 10/08/2021   10:45 AM 09/24/2020    8:27 AM 04/15/2020    8:49 AM  GAD 7 : Generalized Anxiety Score  Nervous, Anxious, on Edge 0 0 0 0  Control/stop worrying 0 0 0 0  Worry too much - different things 1 0 0 0  Trouble relaxing 1 1 0 0  Restless 0 0 0 0  Easily annoyed or irritable $RemoveBefo'1 1 1 'UwxtPBPxbPo$ 0  Afraid - awful might happen 0 0 0 0  Total GAD 7 Score $Remov'3 2 1 'DMsicG$ 0  Anxiety Difficulty Not difficult at all   Not difficult at all       02/23/2022    1:26 PM 10/08/2021   10:45 AM 09/24/2020    8:20 AM  Depression screen PHQ 2/9  Decreased Interest 1 0 0  Down, Depressed, Hopeless 1 0 0  PHQ - 2 Score 2 0 0  Altered sleeping 0 0 1  Tired, decreased energy 0 1 0  Change in appetite 1 2 0  Feeling bad or failure about yourself  1 0 0  Trouble concentrating 1 1 0  Moving slowly or fidgety/restless 0 0 0  Suicidal thoughts 0 0 0  PHQ-9 Score $RemoveBef'5 4 1  'JxgBLlCqCA$ Difficult doing work/chores Not difficult at all Not difficult at all     BP Readings from Last 3 Encounters:  02/23/22 132/86  10/08/21 126/88  02/05/21 (!) 145/90    Physical Exam Vitals and nursing note reviewed.  Constitutional:      General: She is not in acute distress.    Appearance: She is well-developed. She is obese.  HENT:     Head: Normocephalic and atraumatic.  Cardiovascular:     Rate and Rhythm: Normal rate and regular rhythm.     Pulses:          Popliteal pulses are 2+ on the right side and 2+ on the left side.       Dorsalis pedis pulses are 2+ on the right side and 2+ on the left side.  Pulmonary:     Effort: Pulmonary effort is normal. No respiratory distress.     Breath sounds: No wheezing or rhonchi.  Musculoskeletal:     Cervical back: Normal range of motion.     Right lower leg: No edema.     Left lower leg: No edema.  Skin:    General: Skin is warm and dry.     Findings: No rash.  Neurological:     Mental Status: She  is alert and oriented to person, place, and time.  Psychiatric:        Mood and Affect: Mood normal.        Behavior: Behavior normal.     Wt Readings from Last 3 Encounters:  02/23/22 (!) 416 lb (188.7 kg)  10/08/21 (!) 404 lb (183.3 kg)  02/05/21 (!) 389 lb (176.4 kg)    BP 132/86 (BP Location: Right Arm, Cuff Size: Large)   Pulse 95   Ht $R'5\' 6"'Kv$  (1.676 m)   Wt (!) 416 lb (188.7 kg)   LMP 08/03/2015 Comment: fibroids and endometriosis   SpO2 95%   BMI 67.14 kg/m   Assessment and Plan: 1. Type II diabetes mellitus with complication (HCC) V8X is not as good due to weight gain. She is also not tolerating ozempic 2 mg due to nausea and occasional vomiting that lasts 5 days after dosing Will give sample of Mounjaro 2.5 mg - call in several weeks if no allergic reaction for the 5 mg dose. Consider medical weight loss referral. - POCT glycosylated hemoglobin (Hb A1C)  2. Hyperlipidemia associated with type 2 diabetes mellitus ([place]) Start statin therapy in one month Recheck labs in 3 months - atorvastatin (LIPITOR) 10 MG tablet; Take 1 tablet (10 mg total) by mouth daily.  Dispense: 90 tablet; Refill: 1  3. Need for immunization against influenza - Flu Vaccine QUAD 1mo+IM (Fluarix, Fluzone & Alfiuria Quad PF)   Partially dictated using Editor, commissioning. Any errors are unintentional.  Halina Maidens, MD [facility]  02/23/2022

## 2022-03-23 ENCOUNTER — Other Ambulatory Visit: Payer: Self-pay | Admitting: Internal Medicine

## 2022-03-23 ENCOUNTER — Encounter: Payer: Self-pay | Admitting: Internal Medicine

## 2022-03-23 DIAGNOSIS — E118 Type 2 diabetes mellitus with unspecified complications: Secondary | ICD-10-CM

## 2022-03-23 MED ORDER — TIRZEPATIDE 5 MG/0.5ML ~~LOC~~ SOAJ: 5.0000 mg | SUBCUTANEOUS | 0 refills | Status: DC

## 2022-03-24 NOTE — Telephone Encounter (Signed)
Alternative med requested.   Requested Prescriptions  Pending Prescriptions Disp Refills   liraglutide (VICTOZA) 18 MG/3ML SOPN [Pharmacy Med Name: VICTOZA 3-PAK 18 MG/3 ML PEN]  0     Endocrinology:  Diabetes - GLP-1 Receptor Agonists Passed - 03/23/2022 10:08 AM      Passed - HBA1C is between 0 and 7.9 and within 180 days    Hemoglobin A1C  Date Value Ref Range Status  02/23/2022 7.5 (A) 4.0 - 5.6 % Final   Hgb A1c MFr Bld  Date Value Ref Range Status  10/08/2021 6.2 (H) 4.8 - 5.6 % Final    Comment:             Prediabetes: 5.7 - 6.4          Diabetes: >6.4          Glycemic control for adults with diabetes: <7.0          Passed - Valid encounter within last 6 months    Recent Outpatient Visits           4 weeks ago Type II diabetes mellitus with complication ([place])   [facility] [facility] at [facility], Jesse Sans, MD   5 months ago Type II diabetes mellitus with complication [facility])   [place] [facility] at [facility], Jesse Sans, MD   1 year ago Uncontrolled diabetes mellitus with complications [facility])   [place] [facility] at [facility], Jesse Sans, MD   1 year ago Uncontrolled diabetes mellitus with complications [facility])   [place] [facility] at [facility], Jesse Sans, MD   2 years ago Uncontrolled diabetes mellitus with complications [facility])   [place] [facility] at [facility], Jesse Sans, MD       Future Appointments             In 2 months Army Melia, Jesse Sans, MD [place] [facility] at [facility], [facility]

## 2022-03-24 NOTE — Telephone Encounter (Signed)
Please review.  KP

## 2022-03-25 ENCOUNTER — Telehealth: Payer: Self-pay

## 2022-03-25 NOTE — Telephone Encounter (Signed)
PA completed waiting on insurance approval.  Key: [id number]  KP

## 2022-03-25 NOTE — Telephone Encounter (Signed)
PA completed  KP 

## 2022-03-27 NOTE — Telephone Encounter (Signed)
Approved  03/27/2022-03/27/2025  KP

## 2022-03-31 ENCOUNTER — Other Ambulatory Visit: Payer: Self-pay

## 2022-03-31 DIAGNOSIS — E118 Type 2 diabetes mellitus with unspecified complications: Secondary | ICD-10-CM

## 2022-03-31 MED ORDER — TIRZEPATIDE 5 MG/0.5ML ~~LOC~~ SOAJ: 5.0000 mg | SUBCUTANEOUS | 0 refills | Status: DC

## 2022-05-05 ENCOUNTER — Other Ambulatory Visit: Payer: Self-pay | Admitting: Internal Medicine

## 2022-05-05 DIAGNOSIS — E118 Type 2 diabetes mellitus with unspecified complications: Secondary | ICD-10-CM

## 2022-05-05 MED ORDER — TIRZEPATIDE 5 MG/0.5ML ~~LOC~~ SOAJ: 5.0000 mg | SUBCUTANEOUS | 0 refills | Status: DC

## 2022-05-27 ENCOUNTER — Ambulatory Visit: Payer: BC Managed Care – PPO | Admitting: Internal Medicine

## 2022-05-27 NOTE — Progress Notes (Deleted)
Date:  05/27/2022   Name:  Chelsea Willis   DOB:  1985/11/26   MRN:  [id number]   Chief Complaint: No chief complaint on file.  Diabetes She presents for her follow-up diabetic visit. She has type 2 diabetes mellitus. Her disease course has been improving. Current diabetic treatment includes oral agent (monotherapy) (metformin; ozempic changed to [facility] last visit).  Hyperlipidemia This is a chronic problem. Current antihyperlipidemic treatment includes statins (recently started Statins).    Lab Results  Component Value Date   NA 146 (H) 10/08/2021   K 4.4 10/08/2021   CO2 29 10/08/2021   GLUCOSE 85 10/08/2021   BUN 8 10/08/2021   CREATININE 0.76 10/08/2021   CALCIUM 9.5 10/08/2021   EGFR 106 10/08/2021   GFRNONAA 102 04/15/2020   Lab Results  Component Value Date   CHOL 164 10/08/2021   HDL 49 10/08/2021   LDLCALC 101 (H) 10/08/2021   TRIG 72 10/08/2021   CHOLHDL 3.3 10/08/2021   Lab Results  Component Value Date   TSH 0.842 07/25/2019   Lab Results  Component Value Date   HGBA1C 7.5 (A) 02/23/2022   Lab Results  Component Value Date   WBC 8.0 10/08/2021   HGB 13.8 10/08/2021   HCT 43.4 10/08/2021   MCV 85 10/08/2021   PLT 324 10/08/2021   Lab Results  Component Value Date   ALT 12 10/08/2021   AST 12 10/08/2021   ALKPHOS 53 10/08/2021   BILITOT 0.3 10/08/2021   No results found for: "25OHVITD2", "25OHVITD3", "VD25OH"   Review of Systems  Patient Active Problem List   Diagnosis Date Noted   Hyperlipidemia associated with type 2 diabetes mellitus ([place]) 02/23/2022   S/P total hysterectomy 10/08/2021   Type II diabetes mellitus with complication ([place]) 78/46/9629   OSA on CPAP 09/07/2017   Morbid obesity ([place]) 11/03/2013    Allergies  Allergen Reactions   Hydrocodone-Acetaminophen Nausea And Vomiting    Past Surgical History:  Procedure Laterality Date   ABDOMINAL HYSTERECTOMY  09/07/2015   total with salpingectomy   CESAREAN  SECTION  06/08/2013   MYOMECTOMY Right    2012   OOPHORECTOMY Right 06/08/2010    Social History   Tobacco Use   Smoking status: Never   Smokeless tobacco: Never  Vaping Use   Vaping Use: Never used  Substance Use Topics   Alcohol use: No   Drug use: No     Medication list has been reviewed and updated.  No outpatient medications have been marked as taking for the 05/27/22 encounter (Appointment) with Glean Hess, MD.       02/23/2022    1:26 PM 10/08/2021   10:45 AM 09/24/2020    8:27 AM 04/15/2020    8:49 AM  GAD 7 : Generalized Anxiety Score  Nervous, Anxious, on Edge 0 0 0 0  Control/stop worrying 0 0 0 0  Worry too much - different things 1 0 0 0  Trouble relaxing 1 1 0 0  Restless 0 0 0 0  Easily annoyed or irritable _0 0  Afraid - awful might happen 0 0 0 0  Total GAD 7 Score _1 0  Anxiety Difficulty Not difficult at all   Not difficult at all       02/23/2022    1:26 PM 10/08/2021   10:45 AM 09/24/2020    8:20 AM  Depression screen PHQ 2/9  Decreased Interest 1 0 0  Down,  Depressed, Hopeless 1 0 0  PHQ - 2 Score 2 0 0  Altered sleeping 0 0 1  Tired, decreased energy 0 1 0  Change in appetite 1 2 0  Feeling bad or failure about yourself  1 0 0  Trouble concentrating 1 1 0  Moving slowly or fidgety/restless 0 0 0  Suicidal thoughts 0 0 0  PHQ-9 Score _0 Difficult doing work/chores Not difficult at all Not difficult at all     BP Readings from Last 3 Encounters:  02/23/22 132/86  10/08/21 126/88  02/05/21 (!) 145/90    Physical Exam  Wt Readings from Last 3 Encounters:  02/23/22 (!) 416 lb (188.7 kg)  10/08/21 (!) 404 lb (183.3 kg)  02/05/21 (!) 389 lb (176.4 kg)    LMP 08/03/2015 Comment: fibroids and endometriosis   Assessment and Plan:

## 2022-08-19 ENCOUNTER — Ambulatory Visit: Payer: BC Managed Care – PPO | Admitting: Internal Medicine

## 2022-08-19 ENCOUNTER — Encounter: Payer: Self-pay | Admitting: Internal Medicine

## 2022-08-19 VITALS — BP 128/78 | HR 75 | Ht 66.0 in | Wt >= 6400 oz

## 2022-08-19 DIAGNOSIS — E118 Type 2 diabetes mellitus with unspecified complications: Secondary | ICD-10-CM

## 2022-08-19 MED ORDER — TIRZEPATIDE 5 MG/0.5ML ~~LOC~~ SOAJ: 5.0000 mg | SUBCUTANEOUS | 0 refills | Status: DC

## 2022-08-19 NOTE — Progress Notes (Signed)
Date:  08/19/2022   Name:  Chelsea Willis   DOB:  Mar 07, 1986   MRN:  [id number]   Chief Complaint: Diabetes  Diabetes She presents for her follow-up diabetic visit. She has type 2 diabetes mellitus. Her disease course has been stable. Pertinent negatives for hypoglycemia include no headaches or tremors. Pertinent negatives for diabetes include no chest pain, no fatigue, no polydipsia and no polyuria. Current diabetic treatments: Mounjaro 5 mg but stopped metformin. Compliance with diabetes treatment: out of meds for the past 2.5 months. Her weight is fluctuating minimally. Her home blood glucose trend is decreasing steadily. Her breakfast blood glucose is taken between 6-7 am. Her breakfast blood glucose range is generally 90-110 mg/dl. Her dinner blood glucose is taken between 6-7 pm. Her dinner blood glucose range is generally 110-130 mg/dl. An ACE inhibitor/angiotensin II receptor blocker is not being taken. Eye exam is current.    Lab Results  Component Value Date   NA 146 (H) 10/08/2021   K 4.4 10/08/2021   CO2 29 10/08/2021   GLUCOSE 85 10/08/2021   BUN 8 10/08/2021   CREATININE 0.76 10/08/2021   CALCIUM 9.5 10/08/2021   EGFR 106 10/08/2021   GFRNONAA 102 04/15/2020   Lab Results  Component Value Date   CHOL 164 10/08/2021   HDL 49 10/08/2021   LDLCALC 101 (H) 10/08/2021   TRIG 72 10/08/2021   CHOLHDL 3.3 10/08/2021   Lab Results  Component Value Date   TSH 0.842 07/25/2019   Lab Results  Component Value Date   HGBA1C 7.5 (A) 02/23/2022   Lab Results  Component Value Date   WBC 8.0 10/08/2021   HGB 13.8 10/08/2021   HCT 43.4 10/08/2021   MCV 85 10/08/2021   PLT 324 10/08/2021   Lab Results  Component Value Date   ALT 12 10/08/2021   AST 12 10/08/2021   ALKPHOS 53 10/08/2021   BILITOT 0.3 10/08/2021   No results found for: "25OHVITD2", "25OHVITD3", "VD25OH"   Review of Systems  Constitutional:  Negative for appetite change, fatigue, fever and  unexpected weight change.  HENT:  Negative for tinnitus and trouble swallowing.   Eyes:  Negative for visual disturbance.  Respiratory:  Negative for cough, chest tightness and shortness of breath.   Cardiovascular:  Negative for chest pain, palpitations and leg swelling.  Gastrointestinal:  Negative for abdominal pain.  Endocrine: Negative for polydipsia and polyuria.  Genitourinary:  Negative for dysuria and hematuria.  Musculoskeletal:  Negative for arthralgias.  Neurological:  Negative for tremors, numbness and headaches.  Psychiatric/Behavioral:  Negative for dysphoric mood.     Patient Active Problem List   Diagnosis Date Noted   Hyperlipidemia associated with type 2 diabetes mellitus ([place]) 02/23/2022   S/P total hysterectomy 10/08/2021   Type II diabetes mellitus with complication ([place]) [id number]   OSA on CPAP 09/07/2017   Morbid obesity ([place]) 11/03/2013    Allergies  Allergen Reactions   Hydrocodone-Acetaminophen Nausea And Vomiting    Past Surgical History:  Procedure Laterality Date   ABDOMINAL HYSTERECTOMY  09/07/2015   total with salpingectomy   CESAREAN SECTION  06/08/2013   MYOMECTOMY Right    2012   OOPHORECTOMY Right 06/08/2010    Social History   Tobacco Use   Smoking status: Never   Smokeless tobacco: Never  Vaping Use   Vaping Use: Never used  Substance Use Topics   Alcohol use: No   Drug use: No     Medication list has  been reviewed and updated.  Current Meds  Medication Sig   atorvastatin (LIPITOR) 10 MG tablet Take 1 tablet (10 mg total) by mouth daily.   Lancets (ONETOUCH DELICA PLUS Q000111Q) MISC 1 each by Other route in the morning and at bedtime.   ONETOUCH VERIO test strip SMARTSIG:Via Meter 1 to 4 Times Daily   [DISCONTINUED] metFORMIN (GLUCOPHAGE-XR) 500 MG 24 hr tablet Take 1 tablet (500 mg total) by mouth daily with breakfast.   [DISCONTINUED] tirzepatide (MOUNJARO) 5 MG/0.5ML Pen Inject 5 mg into the skin once a week.        08/19/2022   10:20 AM 02/23/2022    1:26 PM 10/08/2021   10:45 AM 09/24/2020    8:27 AM  GAD 7 : Generalized Anxiety Score  Nervous, Anxious, on Edge 1 0 0 0  Control/stop worrying 0 0 0 0  Worry too much - different things 0 1 0 0  Trouble relaxing '1 1 1 '$ 0  Restless 0 0 0 0  Easily annoyed or irritable '1 1 1 1  '$ Afraid - awful might happen 1 0 0 0  Total GAD 7 Score '4 3 2 1  '$ Anxiety Difficulty Not difficult at all Not difficult at all         08/19/2022   10:19 AM 02/23/2022    1:26 PM 10/08/2021   10:45 AM  Depression screen PHQ 2/9  Decreased Interest 0 1 0  Down, Depressed, Hopeless 1 1 0  PHQ - 2 Score 1 2 0  Altered sleeping 0 0 0  Tired, decreased energy 0 0 1  Change in appetite '1 1 2  '$ Feeling bad or failure about yourself  0 1 0  Trouble concentrating '1 1 1  '$ Moving slowly or fidgety/restless 0 0 0  Suicidal thoughts 0 0 0  PHQ-9 Score '3 5 4  '$ Difficult doing work/chores Somewhat difficult Not difficult at all Not difficult at all    BP Readings from Last 3 Encounters:  08/19/22 128/78  02/23/22 132/86  10/08/21 126/88    Physical Exam Vitals and nursing note reviewed.  Constitutional:      General: She is not in acute distress.    Appearance: She is well-developed.  HENT:     Head: Normocephalic and atraumatic.  Cardiovascular:     Rate and Rhythm: Normal rate and regular rhythm.  Pulmonary:     Effort: Pulmonary effort is normal. No respiratory distress.     Breath sounds: No wheezing or rhonchi.  Musculoskeletal:     Cervical back: Normal range of motion.     Right lower leg: No edema.     Left lower leg: No edema.  Lymphadenopathy:     Cervical: No cervical adenopathy.  Skin:    General: Skin is warm and dry.     Findings: No rash.  Neurological:     Mental Status: She is alert and oriented to person, place, and time.  Psychiatric:        Mood and Affect: Mood normal.        Behavior: Behavior normal.     Wt Readings from Last 3  Encounters:  08/19/22 (!) 419 lb (190.1 kg)  02/23/22 (!) 416 lb (188.7 kg)  10/08/21 (!) 404 lb (183.3 kg)    BP 128/78   Pulse 75   Ht '5\' 6"'$  (1.676 m)   Wt (!) 419 lb (190.1 kg)   LMP 08/03/2015 Comment: fibroids and endometriosis   SpO2 95%   BMI  67.63 kg/m   Assessment and Plan:  Problem List Items Addressed This Visit       Endocrine   Type II diabetes mellitus with complication ([place]) - Primary (Chronic)    Clinically stable without s/s of hypoglycemia.  BS are actually controlled off of all medications. Tolerated Mounjaro 5 mg well without side effects or other concerns.  Out of medications for about 3 months Lab Results  Component Value Date   HGBA1C 7.5 (A) 02/23/2022  Will resume Mounjaro 5 mg for one month then she will message for an increase. Labs today      Relevant Medications   tirzepatide [facility]) 5 MG/0.5ML Pen   Other Relevant Orders   Comprehensive metabolic panel   Hemoglobin A1c   Microalbumin / creatinine urine ratio    Return in about 3 months (around 11/19/2022) for DM, HTN.   Partially dictated using [place], any errors are not intentional.  Glean Hess, MD [place], [place]

## 2022-08-19 NOTE — Assessment & Plan Note (Addendum)
Clinically stable without s/s of hypoglycemia.  BS are actually controlled off of all medications. Tolerated Mounjaro 5 mg well without side effects or other concerns.  Out of medications for about 3 months Lab Results  Component Value Date   HGBA1C 7.5 (A) 02/23/2022  Will resume Mounjaro 5 mg for one month then she will message for an increase. Labs today

## 2022-08-21 LAB — COMPREHENSIVE METABOLIC PANEL
ALT: 17 IU/L (ref 0–32)
AST: 13 IU/L (ref 0–40)
Albumin/Globulin Ratio: 1.4 (ref 1.2–2.2)
Albumin: 4.2 g/dL (ref 3.9–4.9)
Alkaline Phosphatase: 65 IU/L (ref 44–121)
BUN/Creatinine Ratio: 11 (ref 9–23)
BUN: 8 mg/dL (ref 6–20)
Bilirubin Total: 0.3 mg/dL (ref 0.0–1.2)
CO2: 26 mmol/L (ref 20–29)
Calcium: 9.5 mg/dL (ref 8.7–10.2)
Chloride: 103 mmol/L (ref 96–106)
Creatinine, Ser: 0.76 mg/dL (ref 0.57–1.00)
Globulin, Total: 2.9 g/dL (ref 1.5–4.5)
Glucose: 125 mg/dL — ABNORMAL HIGH (ref 70–99)
Potassium: 4.1 mmol/L (ref 3.5–5.2)
Sodium: 143 mmol/L (ref 134–144)
Total Protein: 7.1 g/dL (ref 6.0–8.5)
eGFR: 105 mL/min/{1.73_m2} (ref >59)

## 2022-08-21 LAB — HEMOGLOBIN A1C
Est. average glucose Bld gHb Est-mCnc: 169 mg/dL
Hgb A1c MFr Bld: 7.5 % — ABNORMAL HIGH (ref 4.8–5.6)

## 2022-08-21 LAB — MICROALBUMIN / CREATININE URINE RATIO
Creatinine, Urine: 108.4 mg/dL
Microalb/Creat Ratio: 4 mg/g creat (ref 0–29)
Microalbumin, Urine: 3.8 ug/mL

## 2022-08-28 ENCOUNTER — Ambulatory Visit: Payer: BC Managed Care – PPO | Admitting: Internal Medicine

## 2022-09-28 ENCOUNTER — Encounter: Payer: Self-pay | Admitting: Internal Medicine

## 2022-09-28 ENCOUNTER — Other Ambulatory Visit: Payer: Self-pay | Admitting: Internal Medicine

## 2022-09-28 DIAGNOSIS — E118 Type 2 diabetes mellitus with unspecified complications: Secondary | ICD-10-CM

## 2022-09-28 MED ORDER — TIRZEPATIDE 7.5 MG/0.5ML ~~LOC~~ SOAJ: 7.5000 mg | SUBCUTANEOUS | 0 refills | Status: DC

## 2022-09-29 ENCOUNTER — Other Ambulatory Visit: Payer: Self-pay | Admitting: Internal Medicine

## 2022-09-29 DIAGNOSIS — E118 Type 2 diabetes mellitus with unspecified complications: Secondary | ICD-10-CM

## 2022-09-29 NOTE — Telephone Encounter (Signed)
No longer current dosing of this medication Requested Prescriptions  Pending Prescriptions Disp Refills   MOUNJARO 5 MG/0.5ML Pen [Pharmacy Med Name: MOUNJARO 5 MG/0.5 ML PEN]      Sig: INJECT 5 MG SUBCUTANEOUSLY WEEKLY     Off-Protocol Failed - 09/29/2022  1:44 AM      Failed - Medication not assigned to a protocol, review manually.      Passed - Valid encounter within last 12 months    Recent Outpatient Visits           1 month ago Type II diabetes mellitus with complication [facility])   [place] [facility] at [facility], Nyoka Cowden, MD   7 months ago Type II diabetes mellitus with complication [facility])   [place] [facility] at [facility], Nyoka Cowden, MD   11 months ago Type II diabetes mellitus with complication [facility])   [place] [facility] at [facility], Nyoka Cowden, MD   2 years ago Uncontrolled diabetes mellitus with complications [facility])   [place] [facility] at [facility], Nyoka Cowden, MD   2 years ago Uncontrolled diabetes mellitus with complications [facility])   [place] [facility] at [facility], Nyoka Cowden, MD       Future Appointments             In 1 month Judithann Graves, Nyoka Cowden, MD [facility] at [facility], [facility]

## 2022-11-25 ENCOUNTER — Ambulatory Visit: Payer: BC Managed Care – PPO | Admitting: Internal Medicine

## 2023-04-14 ENCOUNTER — Ambulatory Visit
Admission: EM | Admit: 2023-04-14 | Discharge: 2023-04-14 | Disposition: A | Discharge status: Home / Self Care | Payer: BC Managed Care – PPO | Attending: Family Medicine | Admitting: Family Medicine

## 2023-04-14 DIAGNOSIS — J209 Acute bronchitis, unspecified: Secondary | ICD-10-CM | POA: Diagnosis not present

## 2023-04-14 DIAGNOSIS — R030 Elevated blood-pressure reading, without diagnosis of hypertension: Secondary | ICD-10-CM | POA: Diagnosis not present

## 2023-04-14 MED ORDER — AZITHROMYCIN 250 MG PO TABS: ORAL_TABLET | ORAL | 0 refills | Status: DC

## 2023-04-14 MED ORDER — ALBUTEROL SULFATE HFA 108 (90 BASE) MCG/ACT IN AERS: 2.0000 | INHALATION_SPRAY | RESPIRATORY_TRACT | 0 refills | Status: DC | PRN

## 2023-04-14 MED ORDER — PREDNISONE 10 MG (21) PO TBPK: ORAL_TABLET | Freq: Every day | ORAL | 0 refills | Status: DC

## 2023-04-14 MED ORDER — PROMETHAZINE-DM 6.25-15 MG/5ML PO SYRP: 5.0000 mL | ORAL_SOLUTION | Freq: Four times a day (QID) | ORAL | 0 refills | Status: DC | PRN

## 2023-04-14 NOTE — ED Triage Notes (Signed)
Pt c/o cough & bilateral ear pain x1 wk. Has tried mucinex w/o relief.

## 2023-04-14 NOTE — ED Provider Notes (Signed)
[facility]    CSN: [id number] Arrival date & time: 04/14/23  1351      History   Chief Complaint Chief Complaint  Patient presents with   Otalgia   Cough    HPI Chelsea Willis is a 37 y.o. female.   HPI  History obtained from the patient. Chelsea Willis presents for nasal congestion, bilateral ear pain and cough.Right ear hurts more than the left.  Taking Mucinex for the past week.  Feels like she gets short of breathe at night. Has wheezing and chest tightness.  No history of asthma.  No vomiting, diarrhea, rhinorrhea.      Past Medical History:  Diagnosis Date   Endometriosis     Patient Active Problem List   Diagnosis Date Noted   Hyperlipidemia associated with type 2 diabetes mellitus ([facility]) 02/23/2022   S/P total hysterectomy 10/08/2021   Type II diabetes mellitus with complication ([facility]) 09/06/2019   OSA on CPAP 09/07/2017   Morbid obesity ([facility]) 11/03/2013    Past Surgical History:  Procedure Laterality Date   ABDOMINAL HYSTERECTOMY  09/07/2015   total with salpingectomy   CESAREAN SECTION  06/08/2013   MYOMECTOMY Right    2012   OOPHORECTOMY Right 06/08/2010    OB History   No obstetric history on file.      Home Medications    Prior to Admission medications   Medication Sig Start Date End Date Taking? Authorizing Provider  albuterol (VENTOLIN HFA) 108 (90 Base) MCG/ACT inhaler Inhale 2 puffs into the lungs every 4 (four) hours as needed. 04/14/23  Yes Lamone Ferrelli, DO  atorvastatin (LIPITOR) 10 MG tablet Take 1 tablet (10 mg total) by mouth daily. 02/23/22  Yes Reubin Milan, MD  azithromycin (ZITHROMAX Z-PAK) 250 MG tablet Take 2 tablets on day 1 then 1 tablet daily 04/14/23  Yes Xia Stohr, DO  Lancets (ONETOUCH DELICA PLUS LANCET30G) MISC 1 each by Other route in the morning and at bedtime. 04/15/20  Yes Reubin Milan, MD  [facility] VERIO test strip SMARTSIG:Via Meter 1 to 4 Times Daily 10/02/19  Yes [provider]  predniSONE (STERAPRED UNI-PAK 21 TAB) 10 MG (21) TBPK tablet Take by mouth daily. Take 6 tabs by mouth daily for 1, then 5 tabs for 1 day, then 4 tabs for 1 day, then 3 tabs for 1 day, then 2 tabs for 1 day, then 1 tab for 1 day. 04/14/23  Yes Lamisha Roussell, DO  promethazine-dextromethorphan (PROMETHAZINE-DM) 6.25-15 MG/5ML syrup Take 5 mLs by mouth 4 (four) times daily as needed. 04/14/23  Yes Chizara Mena, DO  tirzepatide (MOUNJARO) 7.5 MG/0.5ML Pen Inject 7.5 mg into the skin once a week. 09/28/22  Yes Reubin Milan, MD    Family History Family History  Problem Relation Age of Onset   Hypertension Father    Hypertension Maternal Grandmother    Drug abuse Maternal Grandmother    Hypertension Paternal Grandmother    Drug abuse Paternal Grandmother    Hypertension Paternal Grandfather     Social History Social History   Tobacco Use   Smoking status: Never   Smokeless tobacco: Never  Vaping Use   Vaping status: Never Used  Substance Use Topics   Alcohol use: No   Drug use: No     Allergies   Hydrocodone-acetaminophen and Hydrocodone-acetaminophen   Review of Systems Review of Systems: negative unless otherwise stated in HPI.      Physical Exam Triage Vital Signs ED Triage Vitals  Encounter Vitals Group     BP 04/14/23 1415 (!) 152/108     Systolic BP Percentile --      Diastolic BP Percentile --      Pulse Rate 04/14/23 1415 70     Resp 04/14/23 1415 16     Temp 04/14/23 1415 98.2 F (36.8 C)     Temp Source 04/14/23 1415 Oral     SpO2 04/14/23 1415 94 %     Weight 04/14/23 1415 (!) 384 lb (174.2 kg)     Height 04/14/23 1415 5' (1.524 m)     Head Circumference --      Peak Flow --      Pain Score 04/14/23 1419 6     Pain Loc --      Pain Education --      Exclude from Growth Chart --    No data found.  Updated Vital Signs BP (!) 155/105 (BP Location: Left Wrist)   Pulse 70   Temp 98.2 F (36.8 C) (Oral)   Resp 16   Ht 5'  (1.524 m)   Wt (!) 174.2 kg   LMP 08/03/2015 Comment: fibroids and endometriosis   SpO2 94%   BMI 74.99 kg/m   Visual Acuity Right Eye Distance:   Left Eye Distance:   Bilateral Distance:    Right Eye Near:   Left Eye Near:    Bilateral Near:     Physical Exam GEN:     alert, non-toxic appearing obese female in no distress    HENT:  mucus membranes moist, oropharyngeal without lesions or erythema, no tonsillar hypertrophy or exudates,  moderate erythematous edematous turbinates, clear nasal discharge, bilateral TM normal EYES:   pupils equal and reactive, no scleral injection or discharge RESP:  no increased work of breathing, faint expiratory wheezing, no rales or rhonchi, frequent cough CVS:   regular rate and rhythm Skin:   warm and dry    UC Treatments / Results  Labs (all labs ordered are listed, but only abnormal results are displayed) Labs Reviewed - No data to display  EKG   Radiology No results found.  Procedures Procedures (including critical care time)  Medications Ordered in UC Medications - No data to display  Initial Impression / Assessment and Plan / UC Course  I have reviewed the triage vital signs and the nursing notes.  Pertinent labs & imaging results that were available during my care of the patient were reviewed by me and considered in my medical decision making (see chart for details).       Pt is a 37 y.o. female for respiratory symptoms since 04/04/23. Cough bothers her most.  Chelsea Willis is afebrile here without recent antipyretics. Satting well on room air. Overall pt is non-toxic appearing, well hydrated, without respiratory distress. Pulmonary exam is unremarkable.  COVID testing deferred due to duration of symptoms.  Chest x-ray offered but declined that she needs to pick up her husband in the next 30 minutes.    Suspect beginning of bacterial bronchitis therefore will treat with steroids and antibiotics.  Adderall inhaler for  bronchospasm and wheezing.  Promethazine DM for cough at bedtime.  Discussed symptomatic treatment.  Typical duration of symptoms discussed.  Return to the urgent care or her primary care doctor, if her symptoms do not improve.  Go to the emergency department for worsening shortness of breath or chest pain.  Her blood pressure is elevated here initially 152/108 and after sitting for 20 minutes was  155/105.  This could be augmented by use Mucinex with phenylephrine. She does not take any blood pressure medications.  Advised her to check her blood pressure regularly and follow-up with her primary care doctor in the next 2 to 3 weeks.    Discussed MDM, treatment plan and plan for follow-up with patient who agrees with plan.     Final Clinical Impressions(s) / UC Diagnoses   Final diagnoses:  Acute bronchitis, unspecified organism  Elevated blood pressure reading without diagnosis of hypertension     Discharge Instructions      Stop by the pharmacy to pick up your prescriptions.  Follow up with your primary care provider or return to the urgent care, if not improving.   Monitor your blood pressure at home. Follow up with your primary care provider in 2-3 weeks about your elevated blood pressure.       ED Prescriptions     Medication Sig Dispense Auth. Provider   predniSONE (STERAPRED UNI-PAK 21 TAB) 10 MG (21) TBPK tablet Take by mouth daily. Take 6 tabs by mouth daily for 1, then 5 tabs for 1 day, then 4 tabs for 1 day, then 3 tabs for 1 day, then 2 tabs for 1 day, then 1 tab for 1 day. 21 tablet Jamari Moten, DO   azithromycin (ZITHROMAX Z-PAK) 250 MG tablet Take 2 tablets on day 1 then 1 tablet daily 6 tablet Earlisha Sharples, DO   albuterol (VENTOLIN HFA) 108 (90 Base) MCG/ACT inhaler Inhale 2 puffs into the lungs every 4 (four) hours as needed. 6.7 g Maicy Filip, DO   promethazine-dextromethorphan (PROMETHAZINE-DM) 6.25-15 MG/5ML syrup Take 5 mLs by mouth 4 (four) times daily  as needed. 118 mL Katha Cabal, DO      PDMP not reviewed this encounter.   Katha Cabal, DO 04/14/23 1504

## 2023-04-14 NOTE — Discharge Instructions (Signed)
Stop by the pharmacy to pick up your prescriptions.  Follow up with your primary care provider or return to the urgent care, if not improving.   Monitor your blood pressure at home. Follow up with your primary care provider in 2-3 weeks about your elevated blood pressure.

## 2023-07-14 ENCOUNTER — Encounter: Payer: Self-pay | Admitting: Internal Medicine

## 2023-07-14 ENCOUNTER — Other Ambulatory Visit: Payer: Self-pay

## 2023-07-14 ENCOUNTER — Ambulatory Visit: Payer: 59 | Admitting: Internal Medicine

## 2023-07-14 VITALS — BP 134/90 | HR 76 | Ht 60.0 in | Wt >= 6400 oz

## 2023-07-14 DIAGNOSIS — E1169 Type 2 diabetes mellitus with other specified complication: Secondary | ICD-10-CM

## 2023-07-14 DIAGNOSIS — E118 Type 2 diabetes mellitus with unspecified complications: Secondary | ICD-10-CM

## 2023-07-14 DIAGNOSIS — N951 Menopausal and female climacteric states: Secondary | ICD-10-CM | POA: Diagnosis not present

## 2023-07-14 DIAGNOSIS — Z7985 Long-term (current) use of injectable non-insulin antidiabetic drugs: Secondary | ICD-10-CM

## 2023-07-14 DIAGNOSIS — E785 Hyperlipidemia, unspecified: Secondary | ICD-10-CM

## 2023-07-14 MED ORDER — TIRZEPATIDE 2.5 MG/0.5ML ~~LOC~~ SOAJ: 2.5000 mg | SUBCUTANEOUS | 3 refills | Status: DC

## 2023-07-14 NOTE — Progress Notes (Signed)
 Date:  07/14/2023   Name:  Chelsea Willis   DOB:  26-May-1986   MRN:  [id number]   Chief Complaint: Diabetes (Patient wants to discuss changing mounjaro  to different medicine. She said she is sice for 4 days after injecting medicine- Nausea, Night Sweats, Mood Changes. Needs A1C and Foot Exam. Patient stopped taking Mounjaro  2 months ago.)  Diabetes She presents for her follow-up diabetic visit. She has type 2 diabetes mellitus. Pertinent negatives for hypoglycemia include no dizziness, headaches or nervousness/anxiousness. Pertinent negatives for diabetes include no chest pain, no fatigue, no foot paresthesias, no visual change and no weight loss. Symptoms are stable. When asked about current treatments, none were reported.  Hyperlipidemia This is a chronic problem. Recent lipid tests were reviewed and are high. Pertinent negatives include no chest pain or shortness of breath. Current antihyperlipidemic treatment includes statins. Compliance problems include adherence to diet.   Hot flashes - she is having hot flashes and night sweats.  S/p partial hysterectomy and one oophorectomy.    Review of Systems  Constitutional:  Positive for diaphoresis. Negative for chills, fatigue, fever and weight loss.  Respiratory:  Negative for chest tightness and shortness of breath.   Cardiovascular:  Negative for chest pain and palpitations.  Gastrointestinal:  Negative for abdominal pain.  Genitourinary:  Negative for frequency, menstrual problem and urgency.  Neurological:  Negative for dizziness and headaches.  Psychiatric/Behavioral:  Positive for sleep disturbance. Negative for dysphoric mood. The patient is not nervous/anxious.      Lab Results  Component Value Date   NA 143 08/19/2022   K 4.1 08/19/2022   CO2 26 08/19/2022   GLUCOSE 125 (H) 08/19/2022   BUN 8 08/19/2022   CREATININE 0.76 08/19/2022   CALCIUM  9.5 08/19/2022   EGFR 105 08/19/2022   GFRNONAA 102 04/15/2020   Lab  Results  Component Value Date   CHOL 164 10/08/2021   HDL 49 10/08/2021   LDLCALC 101 (H) 10/08/2021   TRIG 72 10/08/2021   CHOLHDL 3.3 10/08/2021   Lab Results  Component Value Date   TSH 0.842 07/25/2019   Lab Results  Component Value Date   HGBA1C 7.5 (H) 08/19/2022   Lab Results  Component Value Date   WBC 8.0 10/08/2021   HGB 13.8 10/08/2021   HCT 43.4 10/08/2021   MCV 85 10/08/2021   PLT 324 10/08/2021   Lab Results  Component Value Date   ALT 17 08/19/2022   AST 13 08/19/2022   ALKPHOS 65 08/19/2022   BILITOT 0.3 08/19/2022   No results found for: MARIEN BOLLS, VD25OH   Patient Active Problem List   Diagnosis Date Noted   Menopausal hot flushes 07/14/2023   Hyperlipidemia associated with type 2 diabetes mellitus (HCC) 02/23/2022   S/P total hysterectomy 10/08/2021   Type II diabetes mellitus with complication (HCC) 09/06/2019   OSA on CPAP 09/07/2017   Morbid obesity (HCC) 11/03/2013    Allergies  Allergen Reactions   Hydrocodone-Acetaminophen Nausea And Vomiting    Past Surgical History:  Procedure Laterality Date   ABDOMINAL HYSTERECTOMY  09/07/2015   total with salpingectomy   CESAREAN SECTION  06/08/2013   MYOMECTOMY Right    2012   OOPHORECTOMY Right 06/08/2010    Social History   Tobacco Use   Smoking status: Never   Smokeless tobacco: Never  Vaping Use   Vaping status: Never Used  Substance Use Topics   Alcohol use: No   Drug use: No  Medication list has been reviewed and updated.  Current Meds  Medication Sig   atorvastatin  (LIPITOR) 10 MG tablet Take 1 tablet (10 mg total) by mouth daily.   tirzepatide  (MOUNJARO ) 2.5 MG/0.5ML Pen Inject 2.5 mg into the skin once a week.   [DISCONTINUED] Lancets (ONETOUCH DELICA PLUS LANCET30G) MISC 1 each by Other route in the morning and at bedtime.   [DISCONTINUED] ONETOUCH VERIO test strip SMARTSIG:Via Meter 1 to 4 Times Daily       07/14/2023   11:17 AM 08/19/2022    10:20 AM 02/23/2022    1:26 PM 10/08/2021   10:45 AM  GAD 7 : Generalized Anxiety Score  Nervous, Anxious, on Edge 2 1 0 0  Control/stop worrying 1 0 0 0  Worry too much - different things 2 0 1 0  Trouble relaxing 3 1 1 1   Restless 1 0 0 0  Easily annoyed or irritable 3 1 1 1   Afraid - awful might happen 0 1 0 0  Total GAD 7 Score 12 4 3 2   Anxiety Difficulty Very difficult Not difficult at all Not difficult at all        07/14/2023   11:16 AM 08/19/2022   10:19 AM 02/23/2022    1:26 PM  Depression screen PHQ 2/9  Decreased Interest 1 0 1  Down, Depressed, Hopeless 1 1 1   PHQ - 2 Score 2 1 2   Altered sleeping 0 0 0  Tired, decreased energy 2 0 0  Change in appetite 2 1 1   Feeling bad or failure about yourself  0 0 1  Trouble concentrating 1 1 1   Moving slowly or fidgety/restless 2 0 0  Suicidal thoughts 0 0 0  PHQ-9 Score 9 3 5   Difficult doing work/chores Very difficult Somewhat difficult Not difficult at all    BP Readings from Last 3 Encounters:  07/14/23 (!) 134/90  04/14/23 (!) 155/105  08/19/22 128/78    Physical Exam Vitals and nursing note reviewed.  Constitutional:      General: She is not in acute distress.    Appearance: She is well-developed. She is obese.  HENT:     Head: Normocephalic and atraumatic.  Cardiovascular:     Rate and Rhythm: Normal rate and regular rhythm.  Pulmonary:     Effort: Pulmonary effort is normal. No respiratory distress.     Breath sounds: No wheezing or rhonchi.  Musculoskeletal:     Cervical back: Normal range of motion.     Right lower leg: No edema.     Left lower leg: No edema.  Lymphadenopathy:     Cervical: No cervical adenopathy.  Skin:    General: Skin is warm and dry.     Findings: No rash.  Neurological:     Mental Status: She is alert and oriented to person, place, and time.  Psychiatric:        Mood and Affect: Mood normal.        Behavior: Behavior normal.    Diabetic Foot Exam - Simple   Simple Foot  Form Diabetic Foot exam was performed with the following findings: Yes 07/14/2023 11:47 AM  Visual Inspection No deformities, no ulcerations, no other skin breakdown bilaterally: Yes Sensation Testing Intact to touch and monofilament testing bilaterally: Yes Pulse Check Posterior Tibialis and Dorsalis pulse intact bilaterally: Yes Comments      Wt Readings from Last 3 Encounters:  07/14/23 (!) 410 lb (186 kg)  04/14/23 (!) 384 lb (174.2  kg)  08/19/22 (!) 419 lb (190.1 kg)    BP (!) 134/90 (BP Location: Right Arm, Cuff Size: Large)   Pulse 76   Ht 5' (1.524 m)   Wt (!) 410 lb (186 kg)   LMP 08/03/2015 Comment: fibroids and endometriosis   SpO2 94%   BMI 80.07 kg/m   Assessment and Plan:  Problem List Items Addressed This Visit       Unprioritized   Morbid obesity (HCC)   Did not tolerated higher doses of Mounjaro  Will try the 2.5 mg weekly again and re-evaluate in 4 months.      Relevant Medications   tirzepatide  (MOUNJARO ) 2.5 MG/0.5ML Pen   Type II diabetes mellitus with complication (HCC) - Primary (Chronic)   BS controlled - at home fasting 81 or 150 non fasting Still needs weight loss - will try low dose Mounjaro  first - if not tolerated, recommend Farxiga      Relevant Medications   tirzepatide  (MOUNJARO ) 2.5 MG/0.5ML Pen   Other Relevant Orders   CBC with Differential/Platelet   Comprehensive metabolic panel   Hemoglobin A1c   Microalbumin / creatinine urine ratio   Hyperlipidemia associated with type 2 diabetes mellitus (HCC)   Not taking atorvastatin  regularly Will check labs Will send new Rx for atorvastatin  if indicated      Relevant Medications   tirzepatide  (MOUNJARO ) 2.5 MG/0.5ML Pen   Other Relevant Orders   Lipid panel   Menopausal hot flushes   May be in early menopause Will check hormone levels      Relevant Orders   FSH/LH   Other Visit Diagnoses       Long-term current use of injectable noninsulin antidiabetic medication            Return in about 4 months (around 11/11/2023) for DM.    Leita HILARIO Adie, MD [facility] [place]

## 2023-07-14 NOTE — Assessment & Plan Note (Signed)
 May be in early menopause Will check hormone levels

## 2023-07-14 NOTE — Assessment & Plan Note (Signed)
Not taking atorvastatin regularly Will check labs Will send new Rx for atorvastatin if indicated

## 2023-07-14 NOTE — Assessment & Plan Note (Addendum)
 BS controlled - at home fasting 81 or 150 non fasting Still needs weight loss - will try low dose Mounjaro  first - if not tolerated, recommend Farxiga

## 2023-07-14 NOTE — Assessment & Plan Note (Signed)
 Did not tolerated higher doses of Mounjaro  Will try the 2.5 mg weekly again and re-evaluate in 4 months.

## 2023-07-15 ENCOUNTER — Encounter: Payer: Self-pay | Admitting: Internal Medicine

## 2023-07-15 LAB — COMPREHENSIVE METABOLIC PANEL
ALT: 7 [IU]/L (ref 0–32)
AST: 13 [IU]/L (ref 0–40)
Albumin: 3.9 g/dL (ref 3.9–4.9)
Alkaline Phosphatase: 52 [IU]/L (ref 44–121)
BUN/Creatinine Ratio: 12 (ref 9–23)
BUN: 8 mg/dL (ref 6–20)
Bilirubin Total: 0.3 mg/dL (ref 0.0–1.2)
CO2: 26 mmol/L (ref 20–29)
Calcium: 9.4 mg/dL (ref 8.7–10.2)
Chloride: 102 mmol/L (ref 96–106)
Creatinine, Ser: 0.69 mg/dL (ref 0.57–1.00)
Globulin, Total: 2.9 g/dL (ref 1.5–4.5)
Glucose: 100 mg/dL — ABNORMAL HIGH (ref 70–99)
Potassium: 4.3 mmol/L (ref 3.5–5.2)
Sodium: 142 mmol/L (ref 134–144)
Total Protein: 6.8 g/dL (ref 6.0–8.5)
eGFR: 116 mL/min/{1.73_m2} (ref >59)

## 2023-07-15 LAB — CBC WITH DIFFERENTIAL/PLATELET
Basophils Absolute: 0 10*3/uL (ref 0.0–0.2)
Basos: 1 %
EOS (ABSOLUTE): 0.2 10*3/uL (ref 0.0–0.4)
Eos: 3 %
Hematocrit: 42.6 % (ref 34.0–46.6)
Hemoglobin: 13.4 g/dL (ref 11.1–15.9)
Immature Grans (Abs): 0 10*3/uL (ref 0.0–0.1)
Immature Granulocytes: 0 %
Lymphocytes Absolute: 3.2 10*3/uL — ABNORMAL HIGH (ref 0.7–3.1)
Lymphs: 45 %
MCH: 26.9 pg (ref 26.6–33.0)
MCHC: 31.5 g/dL (ref 31.5–35.7)
MCV: 86 fL (ref 79–97)
Monocytes Absolute: 0.6 10*3/uL (ref 0.1–0.9)
Monocytes: 8 %
Neutrophils Absolute: 3.1 10*3/uL (ref 1.4–7.0)
Neutrophils: 43 %
Platelets: 251 10*3/uL (ref 150–450)
RBC: 4.98 x10E6/uL (ref 3.77–5.28)
RDW: 14.5 % (ref 11.7–15.4)
WBC: 7.1 10*3/uL (ref 3.4–10.8)

## 2023-07-15 LAB — LIPID PANEL
Chol/HDL Ratio: 3.2 {ratio} (ref 0.0–4.4)
Cholesterol, Total: 165 mg/dL (ref 100–199)
HDL: 51 mg/dL (ref >39)
LDL Chol Calc (NIH): 100 mg/dL — ABNORMAL HIGH (ref 0–99)
Triglycerides: 75 mg/dL (ref 0–149)
VLDL Cholesterol Cal: 14 mg/dL (ref 5–40)

## 2023-07-15 LAB — FSH/LH
FSH: 3 m[IU]/mL
LH: 4.2 m[IU]/mL

## 2023-07-15 LAB — HEMOGLOBIN A1C
Est. average glucose Bld gHb Est-mCnc: 171 mg/dL
Hgb A1c MFr Bld: 7.6 % — ABNORMAL HIGH (ref 4.8–5.6)

## 2023-07-15 LAB — MICROALBUMIN / CREATININE URINE RATIO
Creatinine, Urine: 191.2 mg/dL
Microalb/Creat Ratio: 2 mg/g{creat} (ref 0–29)
Microalbumin, Urine: 4.4 ug/mL

## 2023-09-15 ENCOUNTER — Ambulatory Visit: Admitting: Student

## 2023-10-15 ENCOUNTER — Encounter: Payer: Self-pay | Admitting: Internal Medicine

## 2023-10-15 ENCOUNTER — Ambulatory Visit: Admitting: Internal Medicine

## 2023-10-15 VITALS — BP 134/78 | HR 72 | Ht 60.0 in | Wt >= 6400 oz

## 2023-10-15 DIAGNOSIS — K5901 Slow transit constipation: Secondary | ICD-10-CM | POA: Insufficient documentation

## 2023-10-15 DIAGNOSIS — K648 Other hemorrhoids: Secondary | ICD-10-CM | POA: Diagnosis not present

## 2023-10-15 MED ORDER — HYDROCORTISONE (PERIANAL) 2.5 % EX CREA: 1.0000 | TOPICAL_CREAM | Freq: Two times a day (BID) | CUTANEOUS | 2 refills | Status: AC

## 2023-10-15 NOTE — Progress Notes (Signed)
 Date:  10/15/2023   Name:  Chelsea Willis   DOB:  Mar 24, 1986   MRN:  [id number]   Chief Complaint: Hemorrhoids and Constipation  Constipation This is a new problem. The current episode started in the past 7 days. Stool frequency: no stool in the past 2 days. The patient is on a high fiber diet. She Does not exercise regularly. There has Been adequate water intake. Associated symptoms include bloating, hematochezia and rectal pain. Pertinent negatives include no abdominal pain, fever or weight loss. She has tried enemas for the symptoms. The treatment provided no relief.  Rectal Bleeding  Episode onset: once today after straining for a stool. The pain is moderate. There was no prior successful therapy. Associated symptoms include rectal pain. Pertinent negatives include no fever, no abdominal pain and no chest pain.    Review of Systems  Constitutional:  Negative for chills, fatigue, fever and weight loss.  Respiratory:  Negative for chest tightness.   Cardiovascular:  Negative for chest pain.  Gastrointestinal:  Positive for bloating, constipation, hematochezia and rectal pain. Negative for abdominal pain.  Psychiatric/Behavioral:  Negative for dysphoric mood and sleep disturbance. The patient is not nervous/anxious.      Lab Results  Component Value Date   NA 142 07/14/2023   K 4.3 07/14/2023   CO2 26 07/14/2023   GLUCOSE 100 (H) 07/14/2023   BUN 8 07/14/2023   CREATININE 0.69 07/14/2023   CALCIUM  9.4 07/14/2023   EGFR 116 07/14/2023   GFRNONAA 102 04/15/2020   Lab Results  Component Value Date   CHOL 165 07/14/2023   HDL 51 07/14/2023   LDLCALC 100 (H) 07/14/2023   TRIG 75 07/14/2023   CHOLHDL 3.2 07/14/2023   Lab Results  Component Value Date   TSH 0.842 07/25/2019   Lab Results  Component Value Date   HGBA1C 7.6 (H) 07/14/2023   Lab Results  Component Value Date   WBC 7.1 07/14/2023   HGB 13.4 07/14/2023   HCT 42.6 07/14/2023   MCV 86 07/14/2023    PLT 251 07/14/2023   Lab Results  Component Value Date   ALT 7 07/14/2023   AST 13 07/14/2023   ALKPHOS 52 07/14/2023   BILITOT 0.3 07/14/2023   No results found for: "25OHVITD2", "25OHVITD3", "VD25OH"   Patient Active Problem List   Diagnosis Date Noted   Constipation by delayed colonic transit 10/15/2023   Menopausal hot flushes 07/14/2023   Hyperlipidemia associated with type 2 diabetes mellitus (HCC) 02/23/2022   S/P total hysterectomy 10/08/2021   Type II diabetes mellitus with complication (HCC) 09/06/2019   OSA on CPAP 09/07/2017   Morbid obesity (HCC) 11/03/2013    Allergies  Allergen Reactions   Hydrocodone-Acetaminophen Nausea And Vomiting    Past Surgical History:  Procedure Laterality Date   ABDOMINAL HYSTERECTOMY  09/07/2015   total with salpingectomy   CESAREAN SECTION  06/08/2013   MYOMECTOMY Right    2012   OOPHORECTOMY Right 06/08/2010    Social History   Tobacco Use   Smoking status: Never   Smokeless tobacco: Never  Vaping Use   Vaping status: Never Used  Substance Use Topics   Alcohol use: No   Drug use: No     Medication list has been reviewed and updated.  Current Meds  Medication Sig   atorvastatin  (LIPITOR) 10 MG tablet Take 1 tablet (10 mg total) by mouth daily.   hydrocortisone  (ANUSOL -HC) 2.5 % rectal cream Place 1 Application rectally 2 (two)  times daily.   tirzepatide  (MOUNJARO ) 2.5 MG/0.5ML Pen Inject 2.5 mg into the skin once a week.       10/15/2023    3:20 PM 07/14/2023   11:17 AM 08/19/2022   10:20 AM 02/23/2022    1:26 PM  GAD 7 : Generalized Anxiety Score  Nervous, Anxious, on Edge 1 2 1  0  Control/stop worrying 0 1 0 0  Worry too much - different things 0 2 0 1  Trouble relaxing 2 3 1 1   Restless 1 1 0 0  Easily annoyed or irritable 2 3 1 1   Afraid - awful might happen 1 0 1 0  Total GAD 7 Score 7 12 4 3   Anxiety Difficulty Somewhat difficult Very difficult Not difficult at all Not difficult at all        10/15/2023    3:20 PM 07/14/2023   11:16 AM 08/19/2022   10:19 AM  Depression screen PHQ 2/9  Decreased Interest 0 1 0  Down, Depressed, Hopeless 0 1 1  PHQ - 2 Score 0 2 1  Altered sleeping 2 0 0  Tired, decreased energy 1 2 0  Change in appetite 1 2 1   Feeling bad or failure about yourself  1 0 0  Trouble concentrating 1 1 1   Moving slowly or fidgety/restless 1 2 0  Suicidal thoughts 0 0 0  PHQ-9 Score 7 9 3   Difficult doing work/chores Somewhat difficult Very difficult Somewhat difficult    BP Readings from Last 3 Encounters:  10/15/23 134/78  07/14/23 (!) 134/90  04/14/23 (!) 155/105    Physical Exam Vitals and nursing note reviewed.  Constitutional:      General: She is not in acute distress.    Appearance: She is well-developed.  HENT:     Head: Normocephalic and atraumatic.  Pulmonary:     Effort: Pulmonary effort is normal. No respiratory distress.  Genitourinary:    Comments: No external mass or fissure DRE - internal hemorrhoids at 4 and 6 oclock - moderately tender No bleeding noted Skin:    General: Skin is warm and dry.     Findings: No rash.  Neurological:     Mental Status: She is alert and oriented to person, place, and time.  Psychiatric:        Mood and Affect: Mood normal.        Behavior: Behavior normal.     Wt Readings from Last 3 Encounters:  10/15/23 (!) 410 lb 2 oz (186 kg)  07/14/23 (!) 410 lb (186 kg)  04/14/23 (!) 384 lb (174.2 kg)    BP 134/78   Pulse 72   Ht 5' (1.524 m)   Wt (!) 410 lb 2 oz (186 kg)   LMP 08/03/2015 Comment: fibroids and endometriosis   SpO2 98%   BMI 80.10 kg/m   Assessment and Plan:  Problem List Items Addressed This Visit       Unprioritized   Constipation by delayed colonic transit   May be secondary to Mounjaro  therapy Begin Miralax  bid - adjust dose to effect Avoid straining to reduce hemorrhoidal pain      Other Visit Diagnoses       Internal hemorrhoids without complication    -  Primary    avoid straining use hydrocortisone  bid call if worsening   Relevant Medications   hydrocortisone  (ANUSOL -HC) 2.5 % rectal cream       Return in about 1 month (around 11/15/2023) for DM.  Sheron Dixons, MD [facility] [place]

## 2023-10-15 NOTE — Patient Instructions (Addendum)
 Miralax  and take one capful twice a day  until good results then once a day.  Avoid straining.

## 2023-10-15 NOTE — Assessment & Plan Note (Signed)
 May be secondary to Mounjaro  therapy Begin Miralax  bid - adjust dose to effect Avoid straining to reduce hemorrhoidal pain

## 2023-11-17 ENCOUNTER — Ambulatory Visit: Admitting: Internal Medicine

## 2024-01-07 ENCOUNTER — Encounter: Payer: Self-pay | Admitting: Internal Medicine

## 2024-02-01 ENCOUNTER — Telehealth: Payer: Self-pay | Admitting: Internal Medicine

## 2024-02-01 NOTE — Telephone Encounter (Signed)
 Left voice mail to set up follow up for DM.

## 2024-03-09 ENCOUNTER — Encounter: Payer: Self-pay | Admitting: Internal Medicine

## 2024-05-10 ENCOUNTER — Ambulatory Visit (INDEPENDENT_AMBULATORY_CARE_PROVIDER_SITE_OTHER): Admitting: Internal Medicine

## 2024-05-10 ENCOUNTER — Telehealth: Payer: Self-pay | Admitting: Internal Medicine

## 2024-05-10 ENCOUNTER — Encounter: Payer: Self-pay | Admitting: Internal Medicine

## 2024-05-10 VITALS — BP 130/84 | Ht 60.0 in | Wt >= 6400 oz

## 2024-05-10 DIAGNOSIS — E1169 Type 2 diabetes mellitus with other specified complication: Secondary | ICD-10-CM

## 2024-05-10 DIAGNOSIS — Z7985 Long-term (current) use of injectable non-insulin antidiabetic drugs: Secondary | ICD-10-CM | POA: Diagnosis not present

## 2024-05-10 DIAGNOSIS — E785 Hyperlipidemia, unspecified: Secondary | ICD-10-CM

## 2024-05-10 DIAGNOSIS — G4733 Obstructive sleep apnea (adult) (pediatric): Secondary | ICD-10-CM | POA: Diagnosis not present

## 2024-05-10 DIAGNOSIS — E118 Type 2 diabetes mellitus with unspecified complications: Secondary | ICD-10-CM

## 2024-05-10 LAB — POCT GLYCOSYLATED HEMOGLOBIN (HGB A1C): Hemoglobin A1C: 8.4 % — AB (ref 4.0–5.6)

## 2024-05-10 MED ORDER — TIRZEPATIDE 2.5 MG/0.5ML ~~LOC~~ SOAJ: 2.5000 mg | SUBCUTANEOUS | 0 refills | Status: DC

## 2024-05-10 NOTE — Telephone Encounter (Signed)
 On Mounjaro  for DM.  Stopped mid year when insurance changed and the cost was too high.  It seems that the issue was just a need for a PA.  I have sent a new Rx and need you to do at PA. Thanks

## 2024-05-10 NOTE — Progress Notes (Signed)
 Date:  05/10/2024   Name:  Chelsea Willis   DOB:  Mar 31, 1986   MRN:  [id number]   Chief Complaint: No chief complaint on file.  Diabetes She presents for her follow-up diabetic visit. She has type 2 diabetes mellitus. Pertinent negatives for hypoglycemia include no headaches or tremors. There are no diabetic associated symptoms. Pertinent negatives for diabetes include no chest pain, no fatigue, no polydipsia and no polyuria. Current diabetic treatments: Mounjaro  2.5 mg weekly started in February.  OSA - on CPAP for 6 + years with the same machine, likely not auto CPAP.  Recently she feels that it may not be working as well and is wondering about a new sleep study and/or new machine.  Review of Systems  Constitutional:  Negative for appetite change, fatigue, fever and unexpected weight change.  HENT:  Negative for tinnitus and trouble swallowing.   Eyes:  Negative for visual disturbance.  Respiratory:  Negative for cough, chest tightness and shortness of breath.   Cardiovascular:  Negative for chest pain, palpitations and leg swelling.  Gastrointestinal:  Negative for abdominal pain.  Endocrine: Negative for polydipsia and polyuria.  Genitourinary:  Negative for dysuria and hematuria.  Musculoskeletal:  Negative for arthralgias.  Neurological:  Negative for tremors, numbness and headaches.  Psychiatric/Behavioral:  Negative for dysphoric mood.      Lab Results  Component Value Date   NA 142 07/14/2023   K 4.3 07/14/2023   CO2 26 07/14/2023   GLUCOSE 100 (H) 07/14/2023   BUN 8 07/14/2023   CREATININE 0.69 07/14/2023   CALCIUM  9.4 07/14/2023   EGFR 116 07/14/2023   GFRNONAA 102 04/15/2020   Lab Results  Component Value Date   CHOL 165 07/14/2023   HDL 51 07/14/2023   LDLCALC 100 (H) 07/14/2023   TRIG 75 07/14/2023   CHOLHDL 3.2 07/14/2023   Lab Results  Component Value Date   TSH 0.842 07/25/2019   Lab Results  Component Value Date   HGBA1C 8.4 (A)  05/10/2024   Lab Results  Component Value Date   WBC 7.1 07/14/2023   HGB 13.4 07/14/2023   HCT 42.6 07/14/2023   MCV 86 07/14/2023   PLT 251 07/14/2023   Lab Results  Component Value Date   ALT 7 07/14/2023   AST 13 07/14/2023   ALKPHOS 52 07/14/2023   BILITOT 0.3 07/14/2023   No results found for: MARIEN BOLLS, VD25OH   Patient Active Problem List   Diagnosis Date Noted   Constipation by delayed colonic transit 10/15/2023   Menopausal hot flushes 07/14/2023   Hyperlipidemia associated with type 2 diabetes mellitus (HCC) 02/23/2022   S/P total hysterectomy 10/08/2021   Type II diabetes mellitus with complication (HCC) 09/06/2019   OSA on CPAP 09/07/2017   Morbid obesity (HCC) 11/03/2013    Allergies  Allergen Reactions   Hydrocodone-Acetaminophen Nausea And Vomiting    Past Surgical History:  Procedure Laterality Date   ABDOMINAL HYSTERECTOMY  09/07/2015   total with salpingectomy   CESAREAN SECTION  06/08/2013   MYOMECTOMY Right    2012   OOPHORECTOMY Right 06/08/2010    Social History   Tobacco Use   Smoking status: Never   Smokeless tobacco: Never  Vaping Use   Vaping status: Never Used  Substance Use Topics   Alcohol use: No   Drug use: No     Medication list has been reviewed and updated.  Current Meds  Medication Sig   atorvastatin  (LIPITOR) 10 MG tablet  Take 1 tablet (10 mg total) by mouth daily.   hydrocortisone  (ANUSOL -HC) 2.5 % rectal cream Place 1 Application rectally 2 (two) times daily.       05/10/2024    2:25 PM 10/15/2023    3:20 PM 07/14/2023   11:17 AM 08/19/2022   10:20 AM  GAD 7 : Generalized Anxiety Score  Nervous, Anxious, on Edge 1 1 2 1   Control/stop worrying 0 0 1 0  Worry too much - different things 1 0 2 0  Trouble relaxing 1 2 3 1   Restless 0 1 1 0  Easily annoyed or irritable 2 2 3 1   Afraid - awful might happen 0 1 0 1  Total GAD 7 Score 5 7 12 4   Anxiety Difficulty Somewhat difficult Somewhat  difficult Very difficult Not difficult at all       05/10/2024    2:25 PM 10/15/2023    3:20 PM 07/14/2023   11:16 AM  Depression screen PHQ 2/9  Decreased Interest 0 0 1  Down, Depressed, Hopeless 0 0 1  PHQ - 2 Score 0 0 2  Altered sleeping 2 2 0  Tired, decreased energy 1 1 2   Change in appetite 1 1 2   Feeling bad or failure about yourself  0 1 0  Trouble concentrating 0 1 1  Moving slowly or fidgety/restless 0 1 2  Suicidal thoughts 0 0 0  PHQ-9 Score 4 7  9    Difficult doing work/chores Somewhat difficult Somewhat difficult Very difficult     Data saved with a previous flowsheet row definition    BP Readings from Last 3 Encounters:  05/10/24 130/84  10/15/23 134/78  07/14/23 (!) 134/90    Physical Exam Vitals and nursing note reviewed.  Constitutional:      General: She is not in acute distress.    Appearance: She is well-developed.  HENT:     Head: Normocephalic and atraumatic.  Cardiovascular:     Rate and Rhythm: Normal rate and regular rhythm.     Pulses: Normal pulses.  Pulmonary:     Effort: Pulmonary effort is normal. No respiratory distress.     Breath sounds: No wheezing or rhonchi.  Musculoskeletal:     Cervical back: Normal range of motion.  Skin:    General: Skin is warm and dry.     Findings: No rash.  Neurological:     Mental Status: She is alert and oriented to person, place, and time.  Psychiatric:        Mood and Affect: Mood normal.        Behavior: Behavior normal.     Wt Readings from Last 3 Encounters:  05/10/24 (!) 424 lb (192.3 kg)  10/15/23 (!) 410 lb 2 oz (186 kg)  07/14/23 (!) 410 lb (186 kg)    BP 130/84 (BP Location: Left Wrist, Patient Position: Sitting, Cuff Size: Large)   Ht 5' (1.524 m)   Wt (!) 424 lb (192.3 kg)   LMP 08/03/2015 Comment: fibroids and endometriosis   BMI 82.81 kg/m   Assessment and Plan:  Problem List Items Addressed This Visit       Unprioritized   Morbid obesity (HCC)   She had lost some  weight with Mounjaro  but has gained back now. Will resume Mounjaro  if covered and PA approved and affordable.      Relevant Medications   tirzepatide  (MOUNJARO ) 2.5 MG/0.5ML Pen   OSA on CPAP (Chronic)   She has been using CPAP  nightly but feels that it may not be working well She has more daytime fatigue and morning headaches. Will need to get a repeat study for a new machine.      Type II diabetes mellitus with complication (HCC) - Primary (Chronic)   Currently medications are non3.  No hypoglycemic episodes noted. Home blood sugars in the 100 range. Last visit medical regimen changes were continue Mounjaro  2.5 mg.  She stopped it about 4 months ago due to insurance. Lab Results  Component Value Date   HGBA1C 7.6 (H) 07/14/2023  A1C today = 8.4 Will resume Mounjaro  - will need PA.  If not affordable, will need Farxiga or Jardiance.       Relevant Medications   tirzepatide  (MOUNJARO ) 2.5 MG/0.5ML Pen   Other Relevant Orders   POCT glycosylated hemoglobin (Hb A1C) (Completed)   Hyperlipidemia associated with type 2 diabetes mellitus (HCC)   Relevant Medications   tirzepatide  (MOUNJARO ) 2.5 MG/0.5ML Pen   Other Visit Diagnoses       Long-term current use of injectable noninsulin antidiabetic medication           Return in about 4 months (around 09/08/2024) for DM, OSA.    Leita HILARIO Adie, MD [facility] [place]

## 2024-05-10 NOTE — Assessment & Plan Note (Signed)
 She has been using CPAP nightly but feels that it may not be working well She has more daytime fatigue and morning headaches. Will need to get a repeat study for a new machine.

## 2024-05-10 NOTE — Assessment & Plan Note (Signed)
 She had lost some weight with Mounjaro  but has gained back now. Will resume Mounjaro  if covered and PA approved and affordable.

## 2024-05-10 NOTE — Assessment & Plan Note (Addendum)
 Currently medications are non3.  No hypoglycemic episodes noted. Home blood sugars in the 100 range. Last visit medical regimen changes were continue Mounjaro  2.5 mg.  She stopped it about 4 months ago due to insurance. Lab Results  Component Value Date   HGBA1C 7.6 (H) 07/14/2023  A1C today = 8.4 Will resume Mounjaro  - will need PA.  If not affordable, will need Farxiga or Jardiance.

## 2024-05-11 ENCOUNTER — Other Ambulatory Visit (HOSPITAL_COMMUNITY): Payer: Self-pay

## 2024-05-11 ENCOUNTER — Telehealth: Payer: Self-pay | Admitting: Pharmacy Technician

## 2024-05-11 NOTE — Telephone Encounter (Signed)
 Pharmacy Patient Advocate Encounter   Received notification from Pt Calls Messages that prior authorization for Mounjaro  2.5 mg/0.5ml pen is required/requested.   Insurance verification completed.   The patient is insured through [facility].   Per test claim: The current 28 day co-pay is, $25.00.  No PA needed at this time. This test claim was processed through [facility]- copay amounts may vary at other pharmacies due to pharmacy/plan contracts, or as the patient moves through the different stages of their insurance plan.

## 2024-05-11 NOTE — Telephone Encounter (Signed)
 Good morning, after test billing I got a paid claim and her copay is $25.00 and that was after her insurance paid and an evoucher from [place] was auto applied:

## 2024-05-23 ENCOUNTER — Encounter: Payer: Self-pay | Admitting: Internal Medicine

## 2024-06-23 ENCOUNTER — Other Ambulatory Visit: Payer: Self-pay

## 2024-06-26 ENCOUNTER — Other Ambulatory Visit: Payer: Self-pay

## 2024-06-26 DIAGNOSIS — E118 Type 2 diabetes mellitus with unspecified complications: Secondary | ICD-10-CM

## 2024-06-26 MED ORDER — TIRZEPATIDE 2.5 MG/0.5ML ~~LOC~~ SOAJ: 2.5000 mg | SUBCUTANEOUS | 0 refills | Status: AC

## 2024-06-28 ENCOUNTER — Telehealth: Payer: Self-pay

## 2024-06-28 ENCOUNTER — Other Ambulatory Visit (HOSPITAL_COMMUNITY): Payer: Self-pay

## 2024-06-28 NOTE — Telephone Encounter (Signed)
 Pharmacy Patient Advocate Encounter   Received notification from Physician's Office that prior authorization for Mounjaro  2.5MG /0.5ML auto-injectors is required/requested.   Insurance verification completed.   The patient is insured through [facility].   Per test claim: PA required; PA submitted to above mentioned insurance via Latent Key/confirmation #/[id number] Status is pending

## 2024-07-04 ENCOUNTER — Other Ambulatory Visit (HOSPITAL_COMMUNITY): Payer: Self-pay

## 2024-07-04 NOTE — Telephone Encounter (Signed)
 Noted.

## 2024-07-04 NOTE — Telephone Encounter (Signed)
 I called insurance because I received message stating prior shara was resolved.   Per Rep patient filled on 06/26/2024. Her next available fill date is 07/17/2024.

## 2024-09-13 ENCOUNTER — Ambulatory Visit: Admitting: Student
